# Patient Record
Sex: Female | Born: 1935 | Race: White | Hispanic: No | Marital: Married | Smoking: Never smoker
Health system: Southern US, Community
[De-identification: ages and names within clinical notes are randomized; demographics above are authoritative.]

## PROBLEM LIST (undated history)

## (undated) DIAGNOSIS — Z8719 Personal history of other diseases of the digestive system: Secondary | ICD-10-CM

## (undated) DIAGNOSIS — H9190 Unspecified hearing loss, unspecified ear: Secondary | ICD-10-CM

## (undated) DIAGNOSIS — G2581 Restless legs syndrome: Secondary | ICD-10-CM

## (undated) DIAGNOSIS — A0472 Enterocolitis due to Clostridium difficile, not specified as recurrent: Secondary | ICD-10-CM

## (undated) DIAGNOSIS — C801 Malignant (primary) neoplasm, unspecified: Secondary | ICD-10-CM

## (undated) DIAGNOSIS — I10 Essential (primary) hypertension: Secondary | ICD-10-CM

## (undated) DIAGNOSIS — R42 Dizziness and giddiness: Secondary | ICD-10-CM

## (undated) DIAGNOSIS — T8859XA Other complications of anesthesia, initial encounter: Secondary | ICD-10-CM

## (undated) DIAGNOSIS — M1990 Unspecified osteoarthritis, unspecified site: Secondary | ICD-10-CM

## (undated) DIAGNOSIS — E079 Disorder of thyroid, unspecified: Secondary | ICD-10-CM

## (undated) DIAGNOSIS — M722 Plantar fascial fibromatosis: Secondary | ICD-10-CM

## (undated) DIAGNOSIS — E039 Hypothyroidism, unspecified: Secondary | ICD-10-CM

## (undated) DIAGNOSIS — T4145XA Adverse effect of unspecified anesthetic, initial encounter: Secondary | ICD-10-CM

## (undated) HISTORY — PX: EYE SURGERY: SHX253

## (undated) HISTORY — PX: CHOLECYSTECTOMY: SHX55

## (undated) HISTORY — PX: TUBAL LIGATION: SHX77

## (undated) HISTORY — PX: ABDOMINAL HYSTERECTOMY: SHX81

## (undated) HISTORY — PX: TONSILLECTOMY: SUR1361

## (undated) HISTORY — PX: RETINAL DETACHMENT REPAIR W/ SCLERAL BUCKLE LE: SHX2338

---

## 1997-10-02 ENCOUNTER — Other Ambulatory Visit: Admission: RE | Admit: 1997-10-02 | Discharge: 1997-10-02 | Payer: Self-pay | Admitting: Obstetrics and Gynecology

## 1998-10-12 ENCOUNTER — Other Ambulatory Visit: Admission: RE | Admit: 1998-10-12 | Discharge: 1998-10-12 | Payer: Self-pay | Admitting: Obstetrics and Gynecology

## 1999-04-21 ENCOUNTER — Ambulatory Visit (HOSPITAL_COMMUNITY): Admission: RE | Admit: 1999-04-21 | Discharge: 1999-04-21 | Payer: Self-pay | Admitting: Gastroenterology

## 1999-08-02 ENCOUNTER — Encounter: Admission: RE | Admit: 1999-08-02 | Discharge: 1999-08-02 | Payer: Self-pay | Admitting: *Deleted

## 1999-10-27 ENCOUNTER — Other Ambulatory Visit: Admission: RE | Admit: 1999-10-27 | Discharge: 1999-10-27 | Payer: Self-pay | Admitting: Obstetrics and Gynecology

## 2000-03-30 ENCOUNTER — Ambulatory Visit (HOSPITAL_COMMUNITY): Admission: RE | Admit: 2000-03-30 | Discharge: 2000-03-30 | Payer: Self-pay | Admitting: Otolaryngology

## 2000-03-30 ENCOUNTER — Encounter: Payer: Self-pay | Admitting: Otolaryngology

## 2000-08-10 ENCOUNTER — Encounter: Admission: RE | Admit: 2000-08-10 | Discharge: 2000-08-10 | Payer: Self-pay | Admitting: Obstetrics and Gynecology

## 2000-08-10 ENCOUNTER — Encounter: Payer: Self-pay | Admitting: Obstetrics and Gynecology

## 2000-11-21 ENCOUNTER — Other Ambulatory Visit: Admission: RE | Admit: 2000-11-21 | Discharge: 2000-11-21 | Payer: Self-pay | Admitting: Obstetrics and Gynecology

## 2001-09-04 ENCOUNTER — Encounter: Payer: Self-pay | Admitting: Internal Medicine

## 2001-09-04 ENCOUNTER — Encounter: Admission: RE | Admit: 2001-09-04 | Discharge: 2001-09-04 | Payer: Self-pay | Admitting: Internal Medicine

## 2002-09-10 ENCOUNTER — Encounter: Admission: RE | Admit: 2002-09-10 | Discharge: 2002-09-10 | Payer: Self-pay | Admitting: Internal Medicine

## 2002-09-10 ENCOUNTER — Encounter: Payer: Self-pay | Admitting: Internal Medicine

## 2002-11-01 ENCOUNTER — Inpatient Hospital Stay (HOSPITAL_COMMUNITY): Admission: EM | Admit: 2002-11-01 | Discharge: 2002-11-04 | Payer: Self-pay | Admitting: Emergency Medicine

## 2002-11-01 ENCOUNTER — Encounter: Payer: Self-pay | Admitting: Internal Medicine

## 2002-11-02 ENCOUNTER — Encounter: Payer: Self-pay | Admitting: Internal Medicine

## 2003-10-07 ENCOUNTER — Encounter: Admission: RE | Admit: 2003-10-07 | Discharge: 2003-10-07 | Payer: Self-pay | Admitting: Obstetrics and Gynecology

## 2004-02-17 ENCOUNTER — Encounter: Admission: RE | Admit: 2004-02-17 | Discharge: 2004-02-17 | Payer: Self-pay | Admitting: Internal Medicine

## 2004-11-03 ENCOUNTER — Encounter: Admission: RE | Admit: 2004-11-03 | Discharge: 2004-11-03 | Payer: Self-pay | Admitting: Obstetrics and Gynecology

## 2005-05-16 ENCOUNTER — Inpatient Hospital Stay (HOSPITAL_COMMUNITY): Admission: EM | Admit: 2005-05-16 | Discharge: 2005-05-18 | Payer: Self-pay | Admitting: Internal Medicine

## 2005-11-01 ENCOUNTER — Encounter: Admission: RE | Admit: 2005-11-01 | Discharge: 2005-12-27 | Payer: Self-pay | Admitting: Otolaryngology

## 2005-11-22 ENCOUNTER — Encounter: Admission: RE | Admit: 2005-11-22 | Discharge: 2005-11-22 | Payer: Self-pay | Admitting: Obstetrics and Gynecology

## 2006-08-01 ENCOUNTER — Encounter: Admission: RE | Admit: 2006-08-01 | Discharge: 2006-08-01 | Payer: Self-pay | Admitting: Internal Medicine

## 2006-11-28 ENCOUNTER — Encounter: Admission: RE | Admit: 2006-11-28 | Discharge: 2006-11-28 | Payer: Self-pay | Admitting: Obstetrics and Gynecology

## 2007-11-29 ENCOUNTER — Encounter: Admission: RE | Admit: 2007-11-29 | Discharge: 2007-11-29 | Payer: Self-pay | Admitting: Obstetrics and Gynecology

## 2008-12-02 ENCOUNTER — Encounter: Admission: RE | Admit: 2008-12-02 | Discharge: 2008-12-02 | Payer: Self-pay | Admitting: Obstetrics and Gynecology

## 2009-12-16 ENCOUNTER — Encounter: Admission: RE | Admit: 2009-12-16 | Discharge: 2009-12-16 | Payer: Self-pay | Admitting: Obstetrics and Gynecology

## 2010-07-09 NOTE — Discharge Summary (Signed)
NAMEGRAELYN, BIHL                        ACCOUNT NO.:  [id number]   MEDICAL RECORD NO.:  [id number]                   PATIENT TYPE:  INP   LOCATION:  4735                                 FACILITY:  [facility]   PHYSICIAN:  Larina Earthly, M.D.                     DATE OF BIRTH:  30-Apr-1934   DATE OF ADMISSION:  11/01/2002  DATE OF DISCHARGE:  11/04/2002                                 DISCHARGE SUMMARY   ADDENDUM:   LABORATORY SECTION:  Portable chest x-ray revealed borderline cardiomegaly  on November 01, 2002.  Admission white blood cell count 10.2 with discharge  white blood cell count 9.0.  The hemoglobin ranged from 12.2-13.4.  The  hematocrit ranged from 36-39%.  The platelet count was in the 200s.  The PT  was 12.3 seconds and PTT 37 seconds.  AST 22, ALT 25, alkaline phosphatase  87, total bilirubin 0.5, albumin 3.7.  The CK was 214 with CK-MB 2.0 and  troponin I 0.1.  The second set was unremarkable.  The total cholesterol was  194, triglycerides 143, HDL cholesterol 54, and LDL cholesterol 111.  The  TSH was normal at 1.033.  The urinalysis was unremarkable.                                                Larina Earthly, M.D.    RA/MEDQ  D:  01/19/2003  T:  01/19/2003  Job:  [id number]

## 2010-07-09 NOTE — Discharge Summary (Signed)
NAMEOREATHA, FABRY                        ACCOUNT NO.:  [id number]   MEDICAL RECORD NO.:  [id number]                   PATIENT TYPE:  INP   LOCATION:  4735                                 FACILITY:  [facility]   PHYSICIAN:  Larina Earthly, M.D.                     DATE OF BIRTH:  15-Apr-1934   DATE OF ADMISSION:  11/01/2002  DATE OF DISCHARGE:  11/04/2002                                 DISCHARGE SUMMARY   DISCHARGE DIAGNOSES:  1. Atrial fibrillation with rapid ventricular response.  2. Chest pain, rule out myocardial infarction.  3. Mild asthmatic bronchitis with seasonal allergic rhinitis.  4. Hypertension.  5. Hyperlipidemia.  6. Hypothyroidism.  7. Anxiety disorder.   DISCHARGE MEDICATIONS:  1. Synthroid 50 mcg p.o. daily.  2. Zocor 40 mg p.o. daily.  3. Protonix 40 mg p.o. daily.  4. Flovent metered dose inhaler two puffs daily.  5. Toprol XL 100 mg daily.   DISCHARGE INSTRUCTIONS:  The patient was instructed to see Dr. Felipa Eth in one  to two weeks and to see Dr. Elease Hashimoto in one to two weeks for followup.   HISTORY OF PRESENT ILLNESS:  This is a 76 year old Caucasian female who  developed intermittent chest pain and shortness of breath that was  attributed to gastroesophageal reflux disease two to three days prior to  admission.  The patient took proton pump inhibitors and Tums with marginal  relief.  The patient exhibited little physical activity in the interim.  On  October 31, 2002, the patient developed intermittent palpitations with  chest pain, felt weak and presented to our office for further evaluation.  At that time she did not exhibit any nausea or vomiting, but did suggest  that she had some questionable sweats and it was unclear whether this was  different from her baseline.  In our office the patient's EKG revealed  atrial fibrillation with a rate up to 150 to 170 with a later heart rate  declining to 100.  The patient felt subjectively weak.  Blood pressure was  stable at 120/76.  The patient was afebrile.  Oxygen saturation was 96% on  room air.  Given the patient's apparent new onset atrial fibrillation and  chest discomfort, the patient was admitted for further evaluation and  management.   HOSPITAL COURSE:  Given the new onset atrial fibrillation, the patient was  started on a calcium channel blockade drip for rate control as well as  heparin intravenously.  Given her atrial fibrillation and chest pain and the  need to rule out for myocardial infarction, cardiology consultation was  obtained from Dr. Patty Sermons and Dr. Elease Hashimoto.  The patient was also continued  on thyroid replacement and was initiated on Zocor given her hyperlipidemic  status.  Previously the patient had been controlled on Benicar 40 mg p.o.  daily for hypertension.   Dr. Patty Sermons came and evaluated the patient and  agreed with ruling out the  patient for myocardial infarction given the patient's multiple risk factors  including family history, hypertension, hypercholesterolemia, and increased  stress.  Paroxysmal atrial fibrillation resolved.  He recommended continuing  IV heparin and beta blockade along with obtaining serial enzymes.   Beta blockade was increased over the next one to two days.  The patient was  up in the room and was chest pain-free.  Cardiac enzymes were unremarkable.  The patient was scheduled for cardiac catheterization given her multiple  risk factors and presenting symptomatology.  The patient was continued on  statins for her hyperlipidemia.  The patient underwent cardiac  catheterization on November 04, 2002, by Dr. Kristeen Miss.  This revealed  relatively smooth and normal coronary arteries, a few minor luminal  irregularities which are appropriate for somebody who is 76 years old.  There was normal left ventricular systolic function.  Given the benign  nature of the patient's cardiac catheterization and the short duration of  her atrial  fibrillation, the patient was discharged on November 04, 2002  with the above-mentioned medications with close followup scheduled with both  Dr. Elease Hashimoto and myself.                                                Larina Earthly, M.D.    RA/MEDQ  D:  01/19/2003  T:  01/19/2003  Job:  [id number]

## 2010-07-09 NOTE — H&P (Signed)
[name], BIDDLE                        ACCOUNT NO.:  [id number]   MEDICAL RECORD NO.:  [id number]                   PATIENT TYPE:  INP   LOCATION:  4735                                 FACILITY:  [facility]   PHYSICIAN:  Cassell Clement, M.D.              DATE OF BIRTH:  1934-08-11   DATE OF ADMISSION:  11/01/2002  DATE OF DISCHARGE:                                HISTORY & PHYSICAL   CHIEF COMPLAINT:  Chest pressure and rapid heart rate.   HISTORY OF PRESENT ILLNESS:  This is a 76 year old married Caucasian female  patient of Dr. Felipa Eth, who is admitted because of chest pressure and  paroxysmal atrial fibrillation. The patient has no known heart disease. She  did have a negative treadmill test Jun 23, 1999 by Dr. Elease Hashimoto. Recently, she  has been experiencing some exertional dyspnea associated with chest  pressure. She first noticed it when climbing up a steep grade in July while  [place]. The symptoms were initially treated with a trial  of proton pump inhibitor with equivocal response. However, the symptoms did  not seem to worsen until the beginning of this week when she noted chest  pressure with ordinary household activity. Last night she lay down to go to  sleep at about 10:00 p.m. and had the onset of chest pressure associated  with heart racing very fast. When she sat up and tried to walk, she felt  like she was about to pass out. The chest pressure and the rapid atrial  fibrillation persisted throughout the evening and then she went to see Dr.  Felipa Eth in his office today and while she was there, she spontaneously  converted from rapid atrial fibrillation back to sinus rhythm, when told  that she would need to go to the hospital.   FAMILY HISTORY:  Reveals that her father died at age 39 after having several  heart attacks. Mother died of Parkinson's. She had a brother who died of  cardiac arrest at age 22. She had a sister who died of cardiac arrest at age  74. She has 2 sisters who have had rapid heart action.   SOCIAL HISTORY:  She is married. Her husband has heart problems and recently  was found to have carotid artery blockage and will need carotid  endarterectomy soon and has seen Dr. Arbie Cookey. The patient has never smoked.  She does not use alcohol. As noted, she is married and they have 2 children,  age 87 and 27, in good health.   PAST MEDICAL HISTORY:  She has a history of borderline hypertension and  initially was on Diovan, more recently switched to Benicar. She has had  history of elevated lipids. She recalls that her LDL was in the range of  148. She had previously refused statin. She has a history of hypothyroidism  and is on Synthroid.   REVIEW OF SYSTEMS:  Reveals  that she has had a history of gastroesophageal  reflux disease. She has had a history of urge incontinence ever since her  last childbirth 27 years ago. Dr. Ambrose Mantle is her gynecologist. She has failed  a trial of Ditropan because of side effects. The patient has been having  complaints of aching in her legs and does have prominent varicose veins  bilaterally. Remainder of review of systems negative in details.   PHYSICAL EXAMINATION:  VITAL SIGNS: Blood pressure is 105/70, pulse 84 and  regular, respirations are normal.  GENERAL: An anxious woman in no acute distress.  SKIN: Clear.  HEENT: Pupils are equal, round, and reactive to light and accommodation.  Sclerae are clear. Mouth and pharynx normal. Carotids normal. Jugular venous  pressure normal. Thyroid not enlarged.  CHEST: Clear to auscultation and percussion. There is no chest wall palpable  tenderness.  HEART: No murmur, gallop or rub. There is no abnormal lift or heave.  ABDOMEN: Soft. Liver and spleen are not enlarged or tender.  EXTREMITIES: Show good peripheral pulses. No phlebitis and no edema. She  does have prominent varicose veins, which are tender posteriorly in both  calves.   LABORATORY DATA:   Electrocardiogram number 1 in Dr. Vicente Males office shows  rapid atrial fibrillation. Electrocardiogram  number 2 shows normal sinus  rhythm. No ischemic changes.   Chest x-ray was taken and is not presently available for review.   Negative cardiac enzymes so far with serial enzymes pending. Her point of  care cardiac markers in the emergency room did show an elevated myoglobin at  289 with CK MB of 3.1 and a troponin I of less than 0.05.   IMPRESSION:  1. Chest pressure, rule out myocardial infarction. Risk factors for coronary     artery disease include positive family history, hypertension,     hypercholesterolemia and recent increased family stress.  2. Paroxysmal atrial fibrillation, resolved.  3. Anxiety/stress.  4. Compensated hypothyroidism.  5. Hypercholesterolemia.   RECOMMENDATIONS:  Continue present medications as ordered by Dr. Felipa Eth.  Serial enzymes as ordered. Continue IV Heparin and beta blocker. We should  get an echocardiogram when available. I believe that she will need cardiac  catheterization this admission. Will set her up for Monday, November 04, 2002 with Dr. Elease Hashimoto. She should continue on IV heparin over the weekend  because of her recent atrial fibrillation and until we know the status of  her coronary arteries. Will follow this weekend in Dr. Harvie Bridge absence.                                                Cassell Clement, M.D.    TB/MEDQ  D:  11/01/2002  T:  11/01/2002  Job:  [id number]   cc:   Larina Earthly, M.D.  [address]  [place]  [place] [address]  Fax: [phone]   Vesta Mixer, M.D.  [address]  [place]  [place] [address]  Fax: [phone]

## 2010-07-09 NOTE — H&P (Signed)
NAMEHARGUN, [name]              ACCOUNT NO.:  [id number]   MEDICAL RECORD NO.:  [id number]          PATIENT TYPE:  INP   LOCATION:  1432                         FACILITY:  [facility]   PHYSICIAN:  Larina Earthly, M.D.        DATE OF BIRTH:  09/26/1934   DATE OF ADMISSION:  05/16/2005  DATE OF DISCHARGE:                                HISTORY & PHYSICAL   CHIEF COMPLAINT:  Nausea, vomiting and diarrhea.   HISTORY OF PRESENT ILLNESS:  This is a 76 year old Caucasian female known to  me with a history of hyperlipidemia, hypertension, multinodular goiter, mild  depression, and a history of coronary artery disease with a history of  paroxysmal atrial fibrillation followed by Dr. Elease Hashimoto, gastroesophageal  reflux disease, who sent to [facility] for a weekend trip by chartered  jet.  This trip started last Friday, May 13, 2005, and returned on Sunday,  May 15, 2005.  Early in the morning hours of May 15, 2005, the patient  developed significant diarrhea occurring 30 times over a 3-4 hour period of  time which awoke her from sleep, followed by nausea and vomiting of non-  bloody emesis.  She continued the same over the next several hours, and was  scheduled to return home on her chartered jet Sunday afternoon.  She,  indeed, did become a little presyncopal, requiring wheelchair assistance  prior to boarding the plane.  Upon arrival here in [place], she presented  to the ER via EMS secondary to continued nausea and vomiting with the  diarrhea abating.  She was found to be febrile with a temperature of 99.7  degrees Fahrenheit, and was thought to be dehydrated secondary to the  nausea, vomiting and diarrhea.  She was thought to possibly have a viral  gastroenteritis secondary to exposure during this recent trip.  She was  subsequently admitted, and now I was called to follow up on her admission  and continue her evaluation and management.  Discussion with the patient  reveals that her  caloric intake has improved significantly.  She has eaten  at least half of her lunch, which consisted of solid food.  She denies any  further nausea, vomiting or diarrhea.  She continues to have a fever, being  as high as 99.8 degrees this morning.  She is still feeling overall weak,  and attributes some of her recurrent nausea and vomiting last night in the  emergency room to increasing episodes of vertigo, for which she has seen Dr.  Hazeline Junker; this is per the patient's report.  She was given Zofran and  significant IV fluids in the emergency room, which continue at this current  time.   REVIEW OF SYSTEMS:  Negative for shaking rigors, chest pain, shortness of  breath, new musculoskeletal or neurological deficits, with the exception of  the vertigo, and one episode of shortness of breath during her emergency  room stay.   PROBLEM LIST:  1.  Hypertension.  2.  Obesity.  3.  History of Clostridium difficile colitis in 1995.  4.  Heel spurs.  5.  Irritable  bowel syndrome.  6.  Hiatal hernia with gastroesophageal reflux disease.  7.  History of diverticulosis.  8.  History of hysterectomy.  9.  History of cholecystectomy.  10. History of lichen sclerosus diagnosed by the dermatologist.  11. History of reactive airway disease.  12. History of hearing loss evaluated by Dr. Jearld Fenton.  13. Coronary artery disease and history of paroxysmal atrial fibrillation      followed by Dr. Elease Hashimoto.   SOCIAL HISTORY:  The patient is married for approximately 45 years.  She has  a high school education.  She is a housewife.  Denies any tobacco or alcohol  use.   FAMILY HISTORY:  1.  Significant for father who died at the age of 41 with coronary artery      disease.  2.  Her mother died at the age of 45 of Parkinson's disease.  3.  The patient does have 3 brothers and 4 sisters with a family history      significant for colon cancer, breast cancer and COPD.  4.  The patient has a daughter and  son who are alive and well.   ALLERGIES:  1.  SULFA.  2.  CODEINE.  3.  IODINE.   MEDICATION INTOLERANCES:  1.  HCTZ results in muscle cramps.  2.  WELLBUTRIN results in hot flashes.   CURRENT MEDICATIONS:  1.  Synthroid 50 mcg each day.  2.  Multivitamin.  3.  Benicar 20 mg each day.  4.  Toprol-XL 100 mg each day.  5.  Enteric-coated aspirin 81 mg recently discontinued secondary to the      thought that this may be causing the vertigo to get worse.  6.  Prilosec over-the-counter.  7.  Clobetasol topical ointment as needed.  8.  Xanax 0.5 mg p.r.n.   LABORATORY DATA:  Sodium 138, potassium 4.0, BUN 16, creatinine 0.9, glucose  122, hematocrit 39%, hemoglobin 13.3.  Additional labs unavailable.  CMET  and CBC are pending.   PHYSICAL EXAMINATION:  GENERAL:  We have an acutely ill-appearing Caucasian  female lying in bed flat, in no apparent distress, answering all of my  questions appropriately, clearly tired.  VITAL SIGNS:  Temperature 99.8 degrees Fahrenheit, blood pressure 129/52,  heart rate 109, respirations 22, oxygen saturation 92% on room air.  HEENT:  Sclerae anicteric.  Extraocular movements are intact.  There is no  nystagmus.  There are no oropharyngeal lesions.  NECK:  Supple.  There is no cervical lymphadenopathy.  LUNGS:  Clear to auscultation bilaterally.  CARDIOVASCULAR:  Tachycardic with a regular rhythm.  ABDOMEN:  Soft, nontender, nondistended.  Bowel sounds are present.  EXTREMITIES:  No edema.  NEUROLOGIC:  Grossly nonfocal.  There is no active synovitis.   ASSESSMENT AND PLAN:  1.  Nausea, vomiting and diarrhea, consistent with a questionable viral      gastroenteritis.  Will follow up on white blood cell count, continue IV      fluid.  Apparently, she may have contracted this during her weekend trip      to [facility].  Will also follow up on electrolytes and monitor     conservatively, given the fact that she appears to be clinically       resolving.  2.  Hypertension.  I will restart Toprol-XL, given her tachycardia, which is      sinus rhythm, and may add Benicar if indicated, or will defer to an      outpatient basis.  3.  Gastroesophageal reflux disease.  Will start Protonix in place of her      over-the-counter Prilosec.  4.  Hypothyroidism.  Will continue Synthroid 50 mcg p.o. daily.  5.  Coronary artery disease and atrial fibrillation, which is paroxysmal.      She did have one documented course of arrhythmias that lasted less than      3 seconds.  This has not been recurrent, and this was in the setting of      a temperature of 100.5 degrees Fahrenheit. Will continue to monitor on      telemetry.  6.  Vertigo.  May need additional follow up on an outpatient basis with Dr.      Jearld Fenton, but it appears to be increasing per the patient's report and      frequency.   DISPOSITION:  Will follow the patient conservatively and hopefully discharge  if this presumed viral gastroenteritis resolves within the next 24 hours.      Larina Earthly, M.D.  Electronically Signed     RA/MEDQ  D:  05/16/2005  T:  05/17/2005  Job:  [id number]

## 2010-07-09 NOTE — Cardiovascular Report (Signed)
[name], [name]                        ACCOUNT NO.:  [id number]   MEDICAL RECORD NO.:  [id number]                   PATIENT TYPE:  INP   LOCATION:  4735                                 FACILITY:  [facility]   PHYSICIAN:  Vesta Mixer, M.D.              DATE OF BIRTH:  08/02/1934   DATE OF PROCEDURE:  11/04/2002  DATE OF DISCHARGE:  11/04/2002                              CARDIAC CATHETERIZATION   Ms. Yerby is a middle age female with history of chest pain.  She had a  negative stress Cardiolite study many years ago.  She was admitted to the  hospital with rapid atrial fibrillation.  She was noted to have some chest  pain at that time.  She ruled out for myocardial infarction, but was  scheduled for heart catheterization for further evaluation of these chest  pains.   PROCEDURE:  Left heart catheterization with coronary angiography.   Initially, we attempted to cannulate the right femoral artery.  We were able  to get a good pulsatile flow, but the wire would not advance.  Small dye  injection was performed which revealed dye extravasation.  The patient  continued to have pulsatile flow.  Pressure was held on this site and we  moved to the left femoral artery.  Access was obtained without complications  using the modified Seldinger technique.   HEMODYNAMICS:  LV pressure was 136/16 with an aortic pressure of 135/68.   CORONARY ANGIOGRAPHY:  1. Left main coronary artery was smooth and normal.  2. The left anterior descending artery was smooth and normal.  3. The first diagonal artery is a moderate size vessel and is normal.  4. The left circumflex artery is a moderate size vessel.  5. The first and second obtuse marginal arteries are smooth and normal.  The     circumflex artery terminates as a small posterior lateral branch which is     normal.  6. The right coronary artery is moderate in size and is dominant. The     posterior descending artery has a few minor luminal  irregularities.  The     posterior lateral segment already has a few minor luminal irregularities.     There was also some catheter induced spasm in the proximal aspect of the     RCA, but this resolved when we pulled the catheter back.   LEFT VENTRICULOGRAM:  Left ventriculogram was performed was a 30 RAO  position.  There was a significant amount of catheter induced ectopy.  The  overall left ventricular systolic function was normal with an ejection  fraction of about 65%.  There was no mitral regurgitation.   A distal aortogram was performed due to the trouble in accessing the right  femoral artery.  It revealed a normal aortic bifurcation with normal femoral  arteries.  There had been significant improvement of the dye extravasation  during the case.  This  should not lead to any complications.   COMPLICATIONS:  None.    CONCLUSIONS:  1. Relatively smooth and normal coronary arteries.  She has a few minor     luminal irregularities which are appropriate for someone who is 76 years     old.  2. Normal left ventricular systolic function.                                                 Vesta Mixer, M.D.    PJN/MEDQ  D:  11/04/2002  T:  11/04/2002  Job:  [id number]

## 2010-07-09 NOTE — Discharge Summary (Signed)
[name], [name]              ACCOUNT NO.:  [id number]   MEDICAL RECORD NO.:  [id number]          PATIENT TYPE:  INP   LOCATION:  1432                         FACILITY:  [facility]   PHYSICIAN:  Larina Earthly, M.D.        DATE OF BIRTH:  Jul 10, 1934   DATE OF ADMISSION:  05/16/2005  DATE OF DISCHARGE:  05/18/2005                                 DISCHARGE SUMMARY   DISCHARGE DIAGNOSES:  1.  Presumed viral gastroenteritis, now resolved with IV fluids without      antibiotics.  Blood cultures negative.  Chest x-ray negative.      Urinalysis negative in the setting of recurrent nausea, vomiting, and      diarrhea.  2.  Vertigo with tinnitus.  Hearing loss and unremarkable brain MRI.      Possibility of Meniere's disease.  In need of followup with Dr. Suzanna Obey.  3.  Gastroesophageal reflux disease, controlled.  4.  Hypertension.  5.  Coronary artery disease with history of atrial fibrillation, paroxysmal.  6.  Multinodular goiter, on thyroid replacement.  7.  History of mild depression.   DISCHARGE MEDICATIONS:  1.  Synthroid 50 mcg each day.  2.  Multivitamins.  3.  Benicar 20 mg each day.  4.  Toprol XL 100 mg each day.  5.  Enteric-coated aspirin, discontinued.  6.  Over-the-counter Prilosec 20 mg each day.  7.  Clobetasol topical ointment as needed.  8.  Xanax 0.5 mg p.r.n.  9.  Meclizine 25 mg p.o. q.6h. p.r.n.  10. Phenergan 25 mg p.o. q.6h. p.r.n.   PERTINENT LABORATORY EVALUATION:  Brain MRI with contrast by verbal report  reveals severe atrophy and small vessel disease but no acute incident or  mass effect.   Chest x-ray reveals no active disease.   LABS ON DISCHARGE:  White blood cell count 5.8, hemoglobin 10.5, hematocrit  31.3%, platelet count 162 after IV hydration.  On admission, white blood  cell count 8.9, hemoglobin 12.3, hematocrit 35.9%, platelet count 210.  Discharge BMET, sodium 141, potassium 3.5, BUN 5, creatinine 0.7.  On  admission, BUN 12 and  creatinine 0.9.  Liver function tests normal.  Albumin  3, calcium 7.3.  Urinalysis unremarkable.  Blood cultures x2 negative, to  date.   HISTORY OF PRESENT ILLNESS:  This is a 76 year old Caucasian female with the  above-mentioned medical problem list who recently returned from a trip to  [facility] with nausea, nausea, and diarrhea.  Please see my history and  physical for extensive details.  Upon arrival back in [place], she  presented to the ER via EMS with continued nausea and vomiting with the  diarrhea abating.  She was febrile with a temperature of 99.7 degrees  Fahrenheit, thought to be dehydrated secondary to the same.  She was thought  to have a presumed viral gastroenteritis and was subsequently admitted with  IV fluids and antiemetics.   Review of systems at the time was remarkable for  increasing episodes of  vertigo along with nausea and vomiting, which was quite separate from  the  weekend's events.   HOSPITAL COURSE:  The patient continued to have recurrent fevers up to 103  degrees Fahrenheit.  Blood cultures were unremarkable.  Again, physical exam  was unremarkable for any clinical evidence of fever; however, she was  continued on IV fluids, antipyretics, and antiemetics.  For symptomatic  relief, antibiotics were not started, given the fact there was no clinical  source of bacterial infection at the time.  With these conservative  measures, her clinical picture quickly resolved by the morning of May 18, 2005, where she was up walking and ambulating the halls.  Her caloric intake  was good, and she remained afebrile.   The significant and concerning issue on the part of the patient was her  recurring and increasing episodes of vertigo, especially when she lies in a  supine fashion.  She did have a brain MRI on the night prior to her  discharge where she had significant small vessel disease and brain atrophy  but no mass effect or indication to give an  etiology for her vertigo by  verbal report.  A written report is still pending.  This was discussed at  length with the patient with respect to her increasing episodes of vertigo,  tinnitus, and the fact that she desires continued followup with Dr. Jearld Fenton  for the same.  We did discuss the possibility of Meniere's disease, and we  also talked about the fact that there may not be an easy remedy to this  issue.  Hence, she was discharged on the antiemetics, meclizine, and  benzodiazepines for symptomatic relief with planned followup with Dr. Jearld Fenton.      Larina Earthly, M.D.  Electronically Signed     RA/MEDQ  D:  05/18/2005  T:  05/19/2005  Job:  [id number]   cc:   Suzanna Obey, M.D.  Fax: [phone]

## 2010-07-09 NOTE — H&P (Signed)
[name], [name]                        ACCOUNT NO.:  [id number]   MEDICAL RECORD NO.:  [id number]                   PATIENT TYPE:  INP   LOCATION:  1844                                 FACILITY:  [facility]   PHYSICIAN:  Larina Earthly, M.D.                     DATE OF BIRTH:  01-19-1935   DATE OF ADMISSION:  11/01/2002  DATE OF DISCHARGE:                                HISTORY & PHYSICAL   CHIEF COMPLAINT:  Chest pain, palpitations, weakness.   HISTORY OF PRESENT ILLNESS:  This is a 76 year old Caucasian female who  developed intermittent chest pain and shortness of breath that the patient  attributed to gastroesophageal reflux disease on September 6 through 7,  earlier this week.  The patient took proton pump inhibitors and Tums with  marginal relief.  The patient exerted herself very little with respect to  physical activity.  On the evening of October 31, 2002, the patient  developed intermittent palpitations with chest pain.  She again thought this  was attributable to gastroesophageal reflux disease and/or upper respiratory  tract infection; however, she did call on the morning of 11/01/2002, to our  office for further evaluation.  She denied any nausea or vomiting and stated  that she did suffer from some sweats, but it was unclear if this was  different from her normal baseline.   In our office, EKG revealed rapids atrial fibrillation with a heart rate of  approximately 150 to 170 with mild orthostatic symptoms.  Blood pressure, as  documented below, was relatively normal with systolic blood pressure being  120.  Her heart rate later spontaneously reverted back to a normal sinus  rhythm without intervention with a heart rate of approximately 80 to 90.  The patient did feel subjectively weak.  Evaluation of the patient's  cardiovascular risk factors includes hyperlipidemia that is untreated,  hypertension treated, and a family history of early heart disease.  The  patient  denies any history of tobacco abuse or type 2 diabetes.  Of note,  the patient did have a negative stress test by Dr. Kristeen Miss in May  2001.   PAST MEDICAL HISTORY:  1. Exogenous obesity.  2. Hypertension.  3. C. difficile colitis.  4. Irritable bowel syndrome.  5. Depression and anxiety disorder.   REVIEW OF SYSTEMS:  As above but negative for fevers, chills, productive  cough, new musculoskeletal or neurological deficits.  Negative for  headaches, negative for blood per rectum or change in bowel habits.  Negative for abdominal pain.   PROBLEM LIST:  1. History of cholecystectomy.  2. History of hysterectomy in 1962, gynecologist, Dr. Ambrose Mantle.  3. Diverticulosis followed by Dr. Matthias Hughs.  4. History of hiatal hernia.  5. History of depression and anxiety disorder.  6. Irritable bowel syndrome.  7. Heel spurs.  8. C. difficile colitis in 1995.  9. Exogenous obesity.  10.  Hypertension diagnosed in 2001.   SOCIAL HISTORY:  The patient is married for approximately 43 years, has a  high school education, is currently a housewife.  Denies any tobacco or  alcohol abuse.   FAMILY HISTORY:  Significant for father having died of heart disease at the  age of 57 and mother having died at the age of 26 with Parkinson's disease.  Siblings family history is significant for colon cancer, breast cancer, and  emphysema in the setting of tobacco abuse.   CURRENT MEDICATIONS:  1. Synthroid 50 mcg p.o. daily.  2. Os-Cal 500 mg with vitamin D twice daily.  3. Multivitamins.  4. Xanax 0.25 mg p.r.n.  5. Prilosec over-the-counter.  6. Benicar 40 mg p.o. daily.   LABORATORY DATA:  During annual physical in June 2004 reveals a benign  urinalysis.  CBC reveals a white blood cell count of 7.3, hemoglobin 13.9,  hematocrit 40.4%, platelet count 273.  TSH 1.380, free T4 normal at 0.92.  Glucose fasting 105.  BUN 17, creatinine 0.6, sodium 142, potassium 4.4,  albumin 4.9, AST 29, ALT 27,  alkaline phosphatase 98, total albumin 0.4.  Total cholesterol 247, triglycerides 227, HDL cholesterol 53, LDL  cholesterol 149.  Stool cards x 3 were unremarkable.   EKG as above, currently now in normal sinus rhythm.   PHYSICAL EXAMINATION:  GENERAL:  Anxious Caucasian female who is conversant  and following commands and able to stand and ambulate slowly around the  facility.  VITAL SIGNS:  Heart rate ranges from 150 to 160 upon arrival in our  facility; however, currently is approximately 80 to 80 and in normal sinus  rhythm.  Blood pressure 120/76, 178 pounds, temperature 98.8 degrees F.  Oxygen saturation 96% on room air during atrial fibrillation.  HEENT:  Sclerae anicteric.  Extraocular movements are intact.  There are no  oropharyngeal lesions.  NECK:  Supple.  There is no cervical lymphadenopathy.  There is no  thyromegaly.  No carotid bruits are appreciated.  LUNGS:  Clear to auscultation bilaterally.  CARDIOVASCULAR:  Regular rate and rhythm without murmurs, rubs, or gallops  appreciated once the patient did revert back to a normal sinus rhythm.  ABDOMEN:  Soft, nontender, nondistended abdomen.  Bowel sounds are present  throughout.  EXTREMITIES:  No edema.  Pedal pulses are 2+ in bilateral feet.  The patient  has full range of motion of all four extremities.  NEUROLOGIC:  Exam is grossly nonfocal.   ASSESSMENT AND PLAN:  1. New onset atrial fibrillation, now back in normal sinus rhythm by exam     and electrocardiogram.  Will obtain rate control and possibly load with     digitoxin per cardiology consultation.  Will use calcium channel blockade     if indicated and start patient on low-dose beta blockade orally.  2. Chest pain, rule out myocardial infarction with serial enzymes.  Will     start heparin drip given recurrent chest pain during this past week.  We    will obtain EKG in the morning and obtain cardiology consult with Dr. Ronny Flurry given new onset  atrial fibrillation and chest pain that     occurred this past one week.  The patient may need cardiac     catheterization.  3. Of note, the patient's fasting CBGs were slightly elevated and will need     to be evaluated for glucose intolerance and metabolic syndrome.  4. Hypothyroidism.  Clinically euthyroid prior  to this past week.  Will     check TSH and free T4 given new onset atrial fibrillation.  TSH was     normal in June 2004 with dose of Synthroid at 50 mcg p.o. daily.  5. Hyperlipidemia.  Will start Zocor at 40 mg p.o. daily.  Defer diet and     exercise management as preferred by patient given the current issues.  6. Anxiety disorder.  Will continue Xanax as needed. Will continue Benicar     once blood pressure is stable in the setting of new onset atrial     fibrillation.                                                Larina Earthly, M.D.    RA/MEDQ  D:  11/01/2002  T:  11/02/2002  Job:  [id number]   cc:   Cassell Clement, M.D.  [address]  [place]  [place] [address]  Fax: [phone]   Vesta Mixer, M.D.  [address], [address]  [place]  [place] [address]  Fax: [phone]

## 2010-12-21 ENCOUNTER — Other Ambulatory Visit: Payer: Self-pay | Admitting: Internal Medicine

## 2010-12-21 DIAGNOSIS — Z1231 Encounter for screening mammogram for malignant neoplasm of breast: Secondary | ICD-10-CM

## 2011-01-26 ENCOUNTER — Ambulatory Visit
Admission: RE | Admit: 2011-01-26 | Discharge: 2011-01-26 | Disposition: A | Discharge status: Home / Self Care | Payer: No Typology Code available for payment source | Source: Ambulatory Visit | Attending: Internal Medicine | Admitting: Internal Medicine

## 2011-01-26 DIAGNOSIS — Z1231 Encounter for screening mammogram for malignant neoplasm of breast: Secondary | ICD-10-CM

## 2011-12-27 ENCOUNTER — Other Ambulatory Visit: Payer: Self-pay | Admitting: Internal Medicine

## 2011-12-27 DIAGNOSIS — Z1231 Encounter for screening mammogram for malignant neoplasm of breast: Secondary | ICD-10-CM

## 2012-02-03 ENCOUNTER — Ambulatory Visit
Admission: RE | Admit: 2012-02-03 | Discharge: 2012-02-03 | Disposition: A | Discharge status: Home / Self Care | Payer: Medicare Other | Source: Ambulatory Visit | Attending: Internal Medicine | Admitting: Internal Medicine

## 2012-02-03 DIAGNOSIS — Z1231 Encounter for screening mammogram for malignant neoplasm of breast: Secondary | ICD-10-CM

## 2012-03-31 ENCOUNTER — Encounter (HOSPITAL_COMMUNITY): Payer: Self-pay | Admitting: Emergency Medicine

## 2012-03-31 ENCOUNTER — Inpatient Hospital Stay (HOSPITAL_COMMUNITY)
Admission: EM | Admit: 2012-03-31 | Discharge: 2012-04-03 | DRG: 641 | Disposition: A | Discharge status: Home / Self Care | Payer: Medicare Other | Attending: Internal Medicine | Admitting: Internal Medicine

## 2012-03-31 DIAGNOSIS — Z66 Do not resuscitate: Secondary | ICD-10-CM | POA: Diagnosis present

## 2012-03-31 DIAGNOSIS — Z79899 Other long term (current) drug therapy: Secondary | ICD-10-CM

## 2012-03-31 DIAGNOSIS — F419 Anxiety disorder, unspecified: Secondary | ICD-10-CM | POA: Diagnosis present

## 2012-03-31 DIAGNOSIS — E039 Hypothyroidism, unspecified: Secondary | ICD-10-CM | POA: Diagnosis present

## 2012-03-31 DIAGNOSIS — R112 Nausea with vomiting, unspecified: Secondary | ICD-10-CM | POA: Diagnosis present

## 2012-03-31 DIAGNOSIS — I498 Other specified cardiac arrhythmias: Secondary | ICD-10-CM | POA: Diagnosis present

## 2012-03-31 DIAGNOSIS — R000 Tachycardia, unspecified: Secondary | ICD-10-CM | POA: Diagnosis present

## 2012-03-31 DIAGNOSIS — R82998 Other abnormal findings in urine: Secondary | ICD-10-CM | POA: Diagnosis present

## 2012-03-31 DIAGNOSIS — E860 Dehydration: Principal | ICD-10-CM | POA: Diagnosis present

## 2012-03-31 DIAGNOSIS — Z7982 Long term (current) use of aspirin: Secondary | ICD-10-CM

## 2012-03-31 DIAGNOSIS — R197 Diarrhea, unspecified: Secondary | ICD-10-CM | POA: Diagnosis present

## 2012-03-31 DIAGNOSIS — R509 Fever, unspecified: Secondary | ICD-10-CM | POA: Diagnosis present

## 2012-03-31 DIAGNOSIS — I10 Essential (primary) hypertension: Secondary | ICD-10-CM | POA: Diagnosis present

## 2012-03-31 DIAGNOSIS — R1115 Cyclical vomiting syndrome unrelated to migraine: Secondary | ICD-10-CM | POA: Diagnosis present

## 2012-03-31 DIAGNOSIS — F411 Generalized anxiety disorder: Secondary | ICD-10-CM

## 2012-03-31 HISTORY — DX: Essential (primary) hypertension: I10

## 2012-03-31 HISTORY — DX: Enterocolitis due to Clostridium difficile, not specified as recurrent: A04.72

## 2012-03-31 HISTORY — DX: Disorder of thyroid, unspecified: E07.9

## 2012-03-31 LAB — CBC WITH DIFFERENTIAL/PLATELET
Basophils Absolute: 0 10*3/uL (ref 0.0–0.1)
Basophils Relative: 0 % (ref 0–1)
Eosinophils Absolute: 0 10*3/uL (ref 0.0–0.7)
HCT: 41.6 % (ref 36.0–46.0)
Lymphs Abs: 0.4 10*3/uL — ABNORMAL LOW (ref 0.7–4.0)
Monocytes Absolute: 0.4 10*3/uL (ref 0.1–1.0)
Neutrophils Relative %: 92 % — ABNORMAL HIGH (ref 43–77)
Platelets: 224 10*3/uL (ref 150–400)
RDW: 12.7 % (ref 11.5–15.5)
WBC: 11.3 10*3/uL — ABNORMAL HIGH (ref 4.0–10.5)

## 2012-03-31 LAB — URINALYSIS, MICROSCOPIC ONLY
Hgb urine dipstick: NEGATIVE
Ketones, ur: NEGATIVE mg/dL
Nitrite: NEGATIVE
pH: 5.5 (ref 5.0–8.0)

## 2012-03-31 LAB — BASIC METABOLIC PANEL
BUN: 24 mg/dL — ABNORMAL HIGH (ref 6–23)
Potassium: 3.9 mEq/L (ref 3.5–5.1)
Sodium: 140 mEq/L (ref 135–145)

## 2012-03-31 LAB — MAGNESIUM: Magnesium: 1.5 mg/dL (ref 1.5–2.5)

## 2012-03-31 MED ORDER — ACETAMINOPHEN 650 MG RE SUPP: 650.0000 mg | Freq: Four times a day (QID) | RECTAL | Status: DC | PRN

## 2012-03-31 MED ORDER — KCL IN DEXTROSE-NACL 20-5-0.45 MEQ/L-%-% IV SOLN
INTRAVENOUS | Status: DC
  Administered 2012-03-31 – 2012-04-03 (×8): via INTRAVENOUS
  Filled 2012-03-31 (×10): qty 1000

## 2012-03-31 MED ORDER — ACETAMINOPHEN 325 MG PO TABS
650.0000 mg | ORAL_TABLET | Freq: Once | ORAL | Status: AC
  Administered 2012-03-31: 650 mg via ORAL
  Filled 2012-03-31: qty 2

## 2012-03-31 MED ORDER — ASPIRIN 81 MG PO CHEW
81.0000 mg | CHEWABLE_TABLET | Freq: Every day | ORAL | Status: DC
  Administered 2012-03-31 – 2012-04-03 (×4): 81 mg via ORAL
  Filled 2012-03-31 (×4): qty 1

## 2012-03-31 MED ORDER — INSULIN REGULAR HUMAN 100 UNIT/ML IJ SOLN: 10.0000 [IU] | Freq: Once | INTRAMUSCULAR | Status: DC

## 2012-03-31 MED ORDER — SODIUM CHLORIDE 0.9 % IV BOLUS (SEPSIS)
1000.0000 mL | INTRAVENOUS | Status: DC
  Administered 2012-03-31: 1000 mL via INTRAVENOUS

## 2012-03-31 MED ORDER — GI COCKTAIL ~~LOC~~
30.0000 mL | Freq: Once | ORAL | Status: AC
  Administered 2012-03-31: 30 mL via ORAL
  Filled 2012-03-31: qty 30

## 2012-03-31 MED ORDER — ONDANSETRON HCL 4 MG/2ML IJ SOLN
4.0000 mg | Freq: Once | INTRAMUSCULAR | Status: AC
  Administered 2012-03-31: 4 mg via INTRAVENOUS
  Filled 2012-03-31: qty 2

## 2012-03-31 MED ORDER — HEPARIN SODIUM (PORCINE) 5000 UNIT/ML IJ SOLN
5000.0000 [IU] | Freq: Three times a day (TID) | INTRAMUSCULAR | Status: DC
  Administered 2012-03-31 – 2012-04-03 (×8): 5000 [IU] via SUBCUTANEOUS
  Filled 2012-03-31 (×11): qty 1

## 2012-03-31 MED ORDER — ONDANSETRON HCL 4 MG PO TABS: 4.0000 mg | ORAL_TABLET | Freq: Four times a day (QID) | ORAL | Status: DC | PRN

## 2012-03-31 MED ORDER — INSULIN ASPART 100 UNIT/ML ~~LOC~~ SOLN: 10.0000 [IU] | Freq: Once | SUBCUTANEOUS | Status: DC

## 2012-03-31 MED ORDER — PANTOPRAZOLE SODIUM 40 MG PO TBEC
40.0000 mg | DELAYED_RELEASE_TABLET | Freq: Every day | ORAL | Status: DC
  Administered 2012-03-31 – 2012-04-03 (×4): 40 mg via ORAL
  Filled 2012-03-31 (×4): qty 1

## 2012-03-31 MED ORDER — SODIUM CHLORIDE 0.9 % IV BOLUS (SEPSIS)
1000.0000 mL | INTRAVENOUS | Status: AC
  Administered 2012-03-31: 1000 mL via INTRAVENOUS

## 2012-03-31 MED ORDER — ALPRAZOLAM 0.5 MG PO TABS
0.5000 mg | ORAL_TABLET | Freq: Once | ORAL | Status: AC
  Administered 2012-03-31: 0.5 mg via ORAL
  Filled 2012-03-31: qty 1

## 2012-03-31 MED ORDER — LEVOTHYROXINE SODIUM 50 MCG PO TABS
50.0000 ug | ORAL_TABLET | Freq: Every day | ORAL | Status: DC
  Administered 2012-04-01 – 2012-04-03 (×3): 50 ug via ORAL
  Filled 2012-03-31 (×5): qty 1

## 2012-03-31 MED ORDER — FAMOTIDINE 20 MG PO TABS
20.0000 mg | ORAL_TABLET | Freq: Once | ORAL | Status: AC
  Administered 2012-03-31: 20 mg via ORAL
  Filled 2012-03-31: qty 1

## 2012-03-31 MED ORDER — SODIUM CHLORIDE 0.9 % IJ SOLN
3.0000 mL | Freq: Two times a day (BID) | INTRAMUSCULAR | Status: DC
  Administered 2012-04-03: 3 mL via INTRAVENOUS

## 2012-03-31 MED ORDER — ONDANSETRON HCL 4 MG/2ML IJ SOLN: 4.0000 mg | Freq: Four times a day (QID) | INTRAMUSCULAR | Status: DC | PRN

## 2012-03-31 MED ORDER — ACETAMINOPHEN 325 MG PO TABS
650.0000 mg | ORAL_TABLET | Freq: Four times a day (QID) | ORAL | Status: DC | PRN
  Administered 2012-04-02 – 2012-04-03 (×2): 650 mg via ORAL
  Filled 2012-03-31 (×2): qty 2

## 2012-03-31 NOTE — H&P (Signed)
[facility] History and Physical  [name] QMV:[id number] DOB: Mar 14, 1934 DOA: 03/31/2012  Referring physician: Dr. Romeo Apple PCP: No primary provider on file.  Specialists: none  Chief Complaint: nausea/vomitting/diarrhea  HPI: [name] is a 78 y.o. female  With history of htn, hypothyroidism, h/o c diff colitis presenting to the ED after developing the above complaints.  Reportedly this occurred after patient ate some sea food last night at a restaurant.  She states that she ate the flounder and salad. This morning around 2 AM she mentions that she developed nausea and subsequently had bout of emesis and diarrhea.  Reportedly 3 bout of emesis/diarrhea an hour since onset.  As listed above since the problem was persistent and did not improve she decided to come to the ED for further evaluation.  She also endorses weakness and dry mouth.  In the ED patient received 2 L normal saline, gi cocktail, pepcid, zofran.  She had some relief with this medication.  Given her persistent weakness, sinus tachycardia, and dehydration we were consulted for further evaluation and recommendations.  Review of Systems: 10 point review of systems reviewed and negative unless otherwise mentioned above.  Past Medical History  Diagnosis Date  . Hypertension   . Thyroid disease   . C. difficile colitis    Past Surgical History  Procedure Laterality Date  . Abdominal hysterectomy    . Tubal ligation    . Cholecystectomy    . Tonsillectomy    . Eye surgery     Social History:  reports that she has never smoked. She does not have any smokeless tobacco history on file. She reports that she does not drink alcohol or use illicit drugs. Lives at home with husband  Can patient participate in ADLs? Yes but not currently due to acute illness  Allergies  Allergen Reactions  . Iodine Rash  . Sulfa Antibiotics Rash    No family history on file. Family history of colon cancer reportedly  Prior  to Admission medications   Medication Sig Start Date End Date Taking? Authorizing Provider  ALPRAZolam Prudy Feeler) 0.5 MG tablet Take 0.5 mg by mouth 3 (three) times daily as needed for sleep or anxiety.   Yes Historical Provider, MD  aspirin 81 MG chewable tablet Chew 81 mg by mouth daily.   Yes Historical Provider, MD  levothyroxine (SYNTHROID, LEVOTHROID) 50 MCG tablet Take 50 mcg by mouth every morning.   Yes Historical Provider, MD  metoprolol succinate (TOPROL-XL) 100 MG 24 hr tablet Take 100 mg by mouth every morning. Take with or immediately following a meal.   Yes Historical Provider, MD  Multiple Vitamin (MULTIVITAMIN WITH MINERALS) TABS Take 1 tablet by mouth daily.   Yes Historical Provider, MD  olmesartan (BENICAR) 20 MG tablet Take 20 mg by mouth every morning.   Yes Historical Provider, MD  omeprazole (PRILOSEC) 20 MG capsule Take 20 mg by mouth daily.   Yes Historical Provider, MD   Physical Exam: Filed Vitals:   03/31/12 1430 03/31/12 1502 03/31/12 1545 03/31/12 1600  BP: 116/53   109/33  Pulse: 113 115 113 112  Temp:  98.5 F (36.9 C)    TempSrc:  Oral    Resp: 19  20 21   SpO2: 95% 100% 93% 92%     General:  Pt in NAD, alert and awake  Eyes: EOMI, non icteric  ENT: normal exterior appearance, dry mucous membranes  Neck: supple, no goiter  Cardiovascular: S1 and s2 wnl, no mrg  Respiratory: CTA BL, no wheezes  Abdomen: soft, NT, ND  Skin: warm and dry  Musculoskeletal: no cyanosis or clubbing  Psychiatric: mood and affect appropriate  Neurologic: moves all extremities equally, no facial asymmetry  Labs on Admission:  Basic Metabolic Panel:  Recent Labs Lab 03/31/12 1126  NA 140  K 3.9  CL 101  CO2 23  GLUCOSE 160*  BUN 24*  CREATININE 1.02  CALCIUM 8.5   Liver Function Tests: No results found for this basename: AST, ALT, ALKPHOS, BILITOT, PROT, ALBUMIN,  in the last 168 hours No results found for this basename: LIPASE, AMYLASE,  in the last  168 hours No results found for this basename: AMMONIA,  in the last 168 hours CBC:  Recent Labs Lab 03/31/12 1126  WBC 11.3*  NEUTROABS 10.4*  HGB 13.5  HCT 41.6  MCV 91.8  PLT 224   Cardiac Enzymes: No results found for this basename: CKTOTAL, CKMB, CKMBINDEX, TROPONINI,  in the last 168 hours  BNP (last 3 results) No results found for this basename: PROBNP,  in the last 8760 hours CBG: No results found for this basename: GLUCAP,  in the last 168 hours  Radiological Exams on Admission: No results found.  EKG: Independently reviewed. Sinus tachycardia, no st elevations or depressions  Assessment/Plan Active Problems:    1. Dehydration: Patient with elevated BUN/Creatinine ratio and 2ary to nausea, vomiting, and diarrhea.  At this point will start patient on MIVF's.  Has already received 2 Liters NS in ED.  Will monitor in telemetry. 2. Nausea/vomiting/diarrhea: Given history could be viral gastroenteritis or more likely food poisoning from recent seafood ingestion (suspecting vibrio cholerae although other organisms possible).  Will treat supportively.  Obtain stool culture and c difficil although there is no reports of recent antibiotic ingestion and as such less likely.  Most likely caused number one.  Maintain on IVF's and monitor.  Will avoid immodium as this could be likely due to infectious etiology. Check magnesium, phosphorus levels. 3. Hypothyroidism: Continue home regimen synthroid.  Check TSH levels 4. Hypertension: given # 1 patient has had soft blood pressures and as such will hold antihypertensive medication 5. Anxiety: continue home xanax regimen.  Code Status: DNR Family Communication: Spoke with patient and sister in law at bedside. Disposition Plan: Discharge likely in 1-2 days with rehydration and improvement in N/V/D  Time spent: > 60 minutes  Penny Pia [facility] Pager [phone]  If 7PM-7AM, please contact  night-coverage [email or web address] Password [facility] 03/31/2012, 4:14 PM

## 2012-03-31 NOTE — ED Notes (Signed)
Per EMS pt came from home where she started having n/v/d around 2am this morning. Pt pt had 4mg  zofran and in route. Pt has vertigo and cant lay flat on back or will get sick on her stomach.

## 2012-03-31 NOTE — ED Notes (Signed)
Patient informed we need a urine specimen, states she is unable to provide one at this time. RN Darl Pikes notified.

## 2012-03-31 NOTE — ED Provider Notes (Signed)
History     CSN: [id number]  Arrival date & time 03/31/12  1015   First MD Initiated Contact with Patient 03/31/12 1029      Chief Complaint  Patient presents with  . Nausea  . Emesis    (Consider location/radiation/quality/duration/timing/severity/associated sxs/prior treatment) Patient is a 78 y.o. female presenting with vomiting. The history is provided by the patient.  Emesis Severity:  Moderate Timing:  Constant Number of daily episodes:  Too many to count Quality:  Stomach contents and bilious material Able to tolerate:  Liquids Chronicity:  New Recent urination:  Normal Relieved by:  Antiemetics Worsened by:  Nothing tried Ineffective treatments:  None tried Associated symptoms: diarrhea   Associated symptoms: no abdominal pain, no cough, no fever and no headaches     Past Medical History  Diagnosis Date  . Hypertension   . Thyroid disease   . C. difficile colitis     Past Surgical History  Procedure Laterality Date  . Abdominal hysterectomy    . Tubal ligation    . Cholecystectomy    . Tonsillectomy    . Eye surgery      No family history on file.  History  Substance Use Topics  . Smoking status: Never Smoker   . Smokeless tobacco: Not on file  . Alcohol Use: No    OB History   Grav Para Term Preterm Abortions TAB SAB Ect Mult Living                  Review of Systems  Constitutional: Negative for fever and fatigue.  HENT: Negative for congestion, drooling and neck pain.   Eyes: Negative for pain.  Respiratory: Negative for cough and shortness of breath.   Cardiovascular: Negative for chest pain.  Gastrointestinal: Positive for nausea, vomiting and diarrhea. Negative for abdominal pain.  Genitourinary: Negative for dysuria and hematuria.  Musculoskeletal: Negative for back pain and gait problem.  Skin: Negative for color change.  Neurological: Negative for dizziness and headaches.  Hematological: Negative for adenopathy.   Psychiatric/Behavioral: Negative for behavioral problems.  All other systems reviewed and are negative.    Allergies  Iodine and Sulfa antibiotics  Home Medications  No current outpatient prescriptions on file.  BP 99/50  Pulse 106  Temp(Src) 98.2 F (36.8 C) (Oral)  Resp 16  Ht 5' 0.5" (1.537 m)  Wt 187 lb (84.823 kg)  BMI 35.91 kg/m2  SpO2 92%  Physical Exam  Nursing note and vitals reviewed. Constitutional: She is oriented to person, place, and time. She appears well-developed and well-nourished.  HENT:  Head: Normocephalic.  Mouth/Throat: No oropharyngeal exudate.  Mild tachy oral mucosa.  Eyes: Conjunctivae and EOM are normal. Pupils are equal, round, and reactive to light.  Neck: Normal range of motion. Neck supple.  Cardiovascular: Regular rhythm, normal heart sounds and intact distal pulses.  Exam reveals no gallop and no friction rub.   No murmur heard. Mild tachcyardia, 109  Pulmonary/Chest: Effort normal and breath sounds normal. No respiratory distress. She has no wheezes.  Abdominal: Soft. Bowel sounds are normal. There is no tenderness. There is no rebound and no guarding.  Musculoskeletal: Normal range of motion. She exhibits no edema and no tenderness.  Neurological: She is alert and oriented to person, place, and time.  Skin: Skin is warm and dry.  Psychiatric: She has a normal mood and affect. Her behavior is normal.    ED Course  Procedures (including critical care time)  Labs Reviewed  URINALYSIS, MICROSCOPIC ONLY - Abnormal; Notable for the following:    APPearance CLOUDY (*)    Leukocytes, UA MODERATE (*)    Squamous Epithelial / LPF FEW (*)    All other components within normal limits  CBC WITH DIFFERENTIAL - Abnormal; Notable for the following:    WBC 11.3 (*)    Neutrophils Relative 92 (*)    Neutro Abs 10.4 (*)    Lymphocytes Relative 4 (*)    Lymphs Abs 0.4 (*)    All other components within normal limits  BASIC METABOLIC PANEL -  Abnormal; Notable for the following:    Glucose, Bld 160 (*)    BUN 24 (*)    GFR calc non Af Amer 52 (*)    GFR calc Af Amer 61 (*)    All other components within normal limits  URINE CULTURE  CLOSTRIDIUM DIFFICILE BY PCR  STOOL CULTURE  MAGNESIUM  PHOSPHORUS  CBC  BASIC METABOLIC PANEL  TSH   No results found.   1. Dehydration   2. Anxiety   3. Nausea vomiting and diarrhea   4. Sinus tachycardia       Date: 03/31/2012  Rate: 109  Rhythm: sinus tachycardia  QRS Axis: normal  Intervals: normal  ST/T Wave abnormalities: normal  Conduction Disutrbances:none  Narrative Interpretation: No new ST or T wave changes cw ischemia  Old EKG Reviewed: changes noted  MDM  10:22 PM 78 y.o. female w hx of HTN, thyroid dis, c. Dif after taking augmentin in 1995 pw sudden onset of N/V/D at 2am this morning. Pt notes sx worsened, presents via EMS. Pt is afebrile, mild tachycardia, denies any cp/abd pain on exam. Suspect viral gastro vs food poisoning as pt had seafood last night. Pt notes she had vertigo in 1995 w/ c. Dif, she does not have vertigo today, but feels like she may develop it due to dehydration. Will hydrate w/ IVF, zofran, screening ecg/urine/labs.   Will admit to hospitalist.   Clinical Impression 1. Dehydration   2. Anxiety   3. Nausea vomiting and diarrhea   4. Sinus tachycardia          Purvis Sheffield, MD 03/31/12 2222

## 2012-03-31 NOTE — ED Notes (Signed)
Attempted to call report. RN unable to take report at this time. Will call back when available

## 2012-03-31 NOTE — ED Notes (Signed)
Attempted to call report again, Rn will call back in 5 minutes

## 2012-03-31 NOTE — ED Notes (Signed)
Pt given water per EDP. 

## 2012-04-01 DIAGNOSIS — R1115 Cyclical vomiting syndrome unrelated to migraine: Secondary | ICD-10-CM

## 2012-04-01 DIAGNOSIS — I10 Essential (primary) hypertension: Secondary | ICD-10-CM

## 2012-04-01 LAB — HEPATIC FUNCTION PANEL
AST: 22 U/L (ref 0–37)
Albumin: 2.8 g/dL — ABNORMAL LOW (ref 3.5–5.2)
Alkaline Phosphatase: 47 U/L (ref 39–117)
Total Bilirubin: 0.3 mg/dL (ref 0.3–1.2)
Total Protein: 5.5 g/dL — ABNORMAL LOW (ref 6.0–8.3)

## 2012-04-01 LAB — BASIC METABOLIC PANEL
Calcium: 7 mg/dL — ABNORMAL LOW (ref 8.4–10.5)
Creatinine, Ser: 0.84 mg/dL (ref 0.50–1.10)
GFR calc Af Amer: 77 mL/min — ABNORMAL LOW (ref >90)

## 2012-04-01 LAB — CBC
MCH: 30.6 pg (ref 26.0–34.0)
MCHC: 32.9 g/dL (ref 30.0–36.0)
MCV: 92.9 fL (ref 78.0–100.0)
Platelets: 160 10*3/uL (ref 150–400)
RDW: 13.5 % (ref 11.5–15.5)

## 2012-04-01 MED ORDER — ALPRAZOLAM 0.25 MG PO TABS
0.2500 mg | ORAL_TABLET | Freq: Every evening | ORAL | Status: DC | PRN
  Administered 2012-04-01 – 2012-04-02 (×2): 0.25 mg via ORAL
  Filled 2012-04-01 (×2): qty 1

## 2012-04-01 NOTE — Progress Notes (Signed)
Utilization review completed.  

## 2012-04-01 NOTE — Progress Notes (Signed)
Pt tolerated clear liquids.

## 2012-04-01 NOTE — Progress Notes (Signed)
[facility] PROGRESS NOTE  VALENA IVANOV NFA:[id number] DOB: 1934-04-08 DOA: 03/31/2012 PCP: No primary provider on file.  Assessment/Plan: Dehydration -Secondary to intractable vomiting/diarrhea -Continue intravenous fluids -Advance diet as tolerated Intractable vomiting/diarrhea -Influenza PCR -Patient had low-grade fever, 100.59F -Vomiting and diarrhea have remitted -C. difficile PCR -Patient takes cephalexin 500 mg once a week -Advance diet as tolerated -Avoid antimotility agents; patient states she took Imodium prior to calling the ambulance Sinus tachycardia -Secondary to dehydration Hypothyroidism -Continue Synthroid -TSH 0.431 Hypertension -Blood pressures remained soft -Continue to hold metoprolol succinate, Benicar    Procedures/Studies:  No results found.      Subjective: Patient denies fevers, chills, chest pain, shortness breath, abdominal pain, dysuria, hematuria, rashes. Nausea, vomiting, diarrhea have remitted. Denies any dizziness or syncope.  Objective: Filed Vitals:   03/31/12 1805 03/31/12 1845 03/31/12 2013 04/01/12 0518  BP:  102/49 99/50 96/41   Pulse:  105 106 93  Temp:  99.9 F (37.7 C) 98.2 F (36.8 C) 98.9 F (37.2 C)  TempSrc:  Oral Oral Oral  Resp:  17 16 16   Height: 5' 0.5" (1.537 m)     Weight: 84.823 kg (187 lb)     SpO2:  98% 92% 94%   No intake or output data in the 24 hours ending 04/01/12 0931 Weight change:  Exam:   General:  Pt is alert, follows commands appropriately, not in acute distress  HEENT: No icterus, No thrush,  [place]/AT  Cardiovascular: RRR, S1/S2, no rubs, no gallops  Respiratory: CTA bilaterally, no wheezing, no crackles, no rhonchi  Abdomen: Soft/+BS, non tender, non distended, no guarding  Extremities: trace edema, No lymphangitis, No petechiae, No rashes, no synovitis  Data Reviewed: Basic Metabolic Panel:  Recent Labs Lab 03/31/12 1126 03/31/12 1945 04/01/12 0425  NA 140  --  136   K 3.9  --  3.7  CL 101  --  106  CO2 23  --  23  GLUCOSE 160*  --  127*  BUN 24*  --  16  CREATININE 1.02  --  0.84  CALCIUM 8.5  --  7.0*  MG  --  1.5  --   PHOS  --  2.6  --    Liver Function Tests: No results found for this basename: AST, ALT, ALKPHOS, BILITOT, PROT, ALBUMIN,  in the last 168 hours No results found for this basename: LIPASE, AMYLASE,  in the last 168 hours No results found for this basename: AMMONIA,  in the last 168 hours CBC:  Recent Labs Lab 03/31/12 1126 04/01/12 0425  WBC 11.3* 7.9  NEUTROABS 10.4*  --   HGB 13.5 11.2*  HCT 41.6 34.0*  MCV 91.8 92.9  PLT 224 160   Cardiac Enzymes: No results found for this basename: CKTOTAL, CKMB, CKMBINDEX, TROPONINI,  in the last 168 hours BNP: No components found with this basename: POCBNP,  CBG: No results found for this basename: GLUCAP,  in the last 168 hours  No results found for this or any previous visit (from the past 240 hour(s)).   Scheduled Meds: . aspirin  81 mg Oral Daily  . heparin  5,000 Units Subcutaneous Q8H  . levothyroxine  50 mcg Oral QAC breakfast  . pantoprazole  40 mg Oral Daily  . sodium chloride  3 mL Intravenous Q12H   Continuous Infusions: . dextrose 5 % and 0.45 % NaCl with KCl 20 mEq/L 125 mL/hr at 04/01/12 0320     Charmika Macdonnell, DO  [facility] Pager  [phone]  If 7PM-7AM, please contact night-coverage [email or web address] Password [email or web address] 04/01/2012, 9:31 AM   LOS: 1 day

## 2012-04-01 NOTE — Progress Notes (Signed)
Pt had no N/V and no BM's during my 12 hour shift. Pt's vitals remained stable.

## 2012-04-01 NOTE — Progress Notes (Deleted)
Pt tolerated clear liquids fine. Will advance diet to full liquids.

## 2012-04-02 DIAGNOSIS — R509 Fever, unspecified: Secondary | ICD-10-CM

## 2012-04-02 LAB — URINALYSIS, MICROSCOPIC ONLY
Glucose, UA: NEGATIVE mg/dL
Nitrite: NEGATIVE
Specific Gravity, Urine: 1.007 (ref 1.005–1.030)
pH: 6 (ref 5.0–8.0)

## 2012-04-02 LAB — BASIC METABOLIC PANEL
BUN: 6 mg/dL (ref 6–23)
Chloride: 107 mEq/L (ref 96–112)
Creatinine, Ser: 0.83 mg/dL (ref 0.50–1.10)
Glucose, Bld: 127 mg/dL — ABNORMAL HIGH (ref 70–99)
Potassium: 4 mEq/L (ref 3.5–5.1)

## 2012-04-02 LAB — CBC
HCT: 33.8 % — ABNORMAL LOW (ref 36.0–46.0)
Hemoglobin: 11.1 g/dL — ABNORMAL LOW (ref 12.0–15.0)
MCH: 30.5 pg (ref 26.0–34.0)
MCHC: 32.8 g/dL (ref 30.0–36.0)

## 2012-04-02 NOTE — Progress Notes (Signed)
[facility] PROGRESS NOTE  [name] ZOX:[id number] DOB: [birth year] DOA: 03/31/2012 PCP: No primary provider on file.  Assessment/Plan: Dehydration  -Secondary to intractable vomiting/diarrhea  -Continue intravenous fluids  -Advance diet to cardiac Intractable vomiting/diarrhea  -Patient has not had any stools since admission -Influenza PCR--negative -Patient continues to have low-grade fever, 100.57F  -Vomiting and diarrhea have remitted  -C. difficile PCR  -Patient takes cephalexin 500 mg once a week  -Avoid antimotility agents; patient states she took Imodium prior to calling the ambulance  Sinus tachycardia  -Secondary to dehydration  -Improving with IV fluids Hypothyroidism  -Continue Synthroid  -TSH 0.431  Hypertension  -Blood pressures continue to remain soft  -Continue to hold metoprolol succinate, Benicar -Check orthostatics Fever/pyuria -Blood cultures x2 sets -Urine culture 50,000 colonies, multiple morphotypes -Repeat UA and urine culture     Family Communication:   Pt at beside Disposition Plan:   Home when medically stable      Procedures/Studies:  No results found.      Subjective: Patient is feeling better. No vomiting or diarrhea in the last 24 hours. Feels a little weak. Denies fevers, chills, chest pain, shortness breath, abdominal pain, dysuria, hematuria, dizziness. She feels like she can eat today. He tolerated clear liquids.  Objective: Filed Vitals:   04/01/12 1400 04/01/12 2130 04/02/12 0503 04/02/12 0613  BP: 91/42 122/47  104/43  Pulse: 89 96  94  Temp: 98.9 F (37.2 C) 100.5 F (38.1 C) 100 F (37.8 C) 99.2 F (37.3 C)  TempSrc: Oral Oral Oral Oral  Resp: 16 20  18   Height:      Weight:      SpO2: 97% 92% 95% 95%    Intake/Output Summary (Last 24 hours) at 04/02/12 0847 Last data filed at 04/02/12 0600  Gross per 24 hour  Intake 4413.33 ml  Output      0 ml  Net 4413.33 ml   Weight change:   Exam:   General:  Pt is alert, follows commands appropriately, not in acute distress  HEENT: No icterus, No thrush, No neck mass, [place]/AT  Cardiovascular: RRR, S1/S2, no rubs, no gallops  Respiratory: CTA bilaterally, no wheezing, no crackles, no rhonchi  Abdomen: Soft/+BS, non tender, non distended, no guarding  Extremities: No edema, No lymphangitis, No petechiae, No rashes, no synovitis  Data Reviewed: Basic Metabolic Panel:  Recent Labs Lab 03/31/12 1126 03/31/12 1945 04/01/12 0425 04/02/12 0520  NA 140  --  136 137  K 3.9  --  3.7 4.0  CL 101  --  106 107  CO2 23  --  23 24  GLUCOSE 160*  --  127* 127*  BUN 24*  --  16 6  CREATININE 1.02  --  0.84 0.83  CALCIUM 8.5  --  7.0* 7.4*  MG  --  1.5  --   --   PHOS  --  2.6  --   --    Liver Function Tests:  Recent Labs Lab 04/01/12 0425  AST 22  ALT 15  ALKPHOS 47  BILITOT 0.3  PROT 5.5*  ALBUMIN 2.8*   No results found for this basename: LIPASE, AMYLASE,  in the last 168 hours No results found for this basename: AMMONIA,  in the last 168 hours CBC:  Recent Labs Lab 03/31/12 1126 04/01/12 0425 04/02/12 0520  WBC 11.3* 7.9 7.0  NEUTROABS 10.4*  --   --   HGB 13.5 11.2* 11.1*  HCT 41.6 34.0* 33.8*  MCV 91.8 92.9 92.9  PLT 224 160 157   Cardiac Enzymes: No results found for this basename: CKTOTAL, CKMB, CKMBINDEX, TROPONINI,  in the last 168 hours BNP: No components found with this basename: POCBNP,  CBG: No results found for this basename: GLUCAP,  in the last 168 hours  Recent Results (from the past 240 hour(s))  URINE CULTURE     Status: None   Collection Time    03/31/12  6:04 PM      Result Value Range Status   Specimen Description URINE, CLEAN CATCH   Final   Special Requests NONE   Final   Culture  Setup Time 04/01/2012 03:27   Final   Colony Count 50,000 COLONIES/ML   Final   Culture     Final   Value: Multiple bacterial morphotypes present, none predominant. Suggest appropriate  recollection if clinically indicated.   Report Status 04/02/2012 FINAL   Final     Scheduled Meds: . aspirin  81 mg Oral Daily  . heparin  5,000 Units Subcutaneous Q8H  . levothyroxine  50 mcg Oral QAC breakfast  . pantoprazole  40 mg Oral Daily  . sodium chloride  3 mL Intravenous Q12H   Continuous Infusions: . dextrose 5 % and 0.45 % NaCl with KCl 20 mEq/L 125 mL/hr at 04/02/12 0348     Lauris Serviss, DO  [facility] Pager [phone]  If 7PM-7AM, please contact night-coverage [email or web address] Password [email or web address] 04/02/2012, 8:47 AM   LOS: 2 days

## 2012-04-02 NOTE — ED Provider Notes (Signed)
I saw and evaluated the patient, reviewed the resident's note and I agree with the findings and plan. Pt with nv. abd soft nt. Ivf.   Suzi Roots, MD 04/02/12 [phone]

## 2012-04-03 LAB — CBC
HCT: 32.4 % — ABNORMAL LOW (ref 36.0–46.0)
Hemoglobin: 10.8 g/dL — ABNORMAL LOW (ref 12.0–15.0)
MCHC: 33.3 g/dL (ref 30.0–36.0)
RBC: 3.54 MIL/uL — ABNORMAL LOW (ref 3.87–5.11)

## 2012-04-03 LAB — BASIC METABOLIC PANEL
BUN: 5 mg/dL — ABNORMAL LOW (ref 6–23)
Chloride: 107 mEq/L (ref 96–112)
GFR calc Af Amer: >90 mL/min (ref >90)
GFR calc non Af Amer: 82 mL/min — ABNORMAL LOW (ref >90)
Glucose, Bld: 153 mg/dL — ABNORMAL HIGH (ref 70–99)
Potassium: 3.9 mEq/L (ref 3.5–5.1)
Sodium: 140 mEq/L (ref 135–145)

## 2012-04-03 LAB — URINE CULTURE

## 2012-04-03 MED ORDER — LORAZEPAM 0.5 MG PO TABS: 0.5000 mg | ORAL_TABLET | Freq: Once | ORAL | Status: DC

## 2012-04-03 MED ORDER — ALBUTEROL SULFATE (5 MG/ML) 0.5% IN NEBU
5.0000 mg | INHALATION_SOLUTION | Freq: Once | RESPIRATORY_TRACT | Status: AC
  Administered 2012-04-03: 5 mg via RESPIRATORY_TRACT
  Filled 2012-04-03: qty 1

## 2012-04-03 NOTE — Discharge Summary (Signed)
Physician Discharge Summary  [name] ZOX:[id number] DOB: March 15, 1934 DOA: 03/31/2012  PCP: No primary provider on file.  Admit date: 03/31/2012 Discharge date: 04/03/2012  Recommendations for Outpatient Follow-up:  1. Pt will need to follow up with PCP in 1 week post discharge 2. Please obtain BMP to evaluate electrolytes and kidney function 3. Please also check CBC to evaluate Hg and Hct levels 4. Patient instructed to not restart Toprol XL or Benicar until she follows up with her primary care physician  Discharge Diagnoses:  Dehydration  -Secondary to intractable vomiting/diarrhea  -improved with intravenous fluids  -Advance diet to cardiac--tolerated without problem Intractable vomiting/diarrhea  -Patient has not had any stools or vomiting since admission  -Influenza PCR--negative  -Patient had low-grade fever, 100.28F but this subsequently resolved -Patient was afebrile for over 24 hours prior to discharge -Patient's UA does not suggest UTI, blood cultures were negative on the day of discharge -Patient remained hemodynamically stable -Vomiting and diarrhea have remitted  -C. difficile PCR--patient never had a stool to collect -Patient takes cephalexin 500 mg once a week  -Avoid antimotility agents; patient states she took Imodium prior to calling the ambulance  Sinus tachycardia  -Secondary to dehydration  -Improved with IV fluids  Hypothyroidism  -Continue Synthroid  -TSH 0.431  Hypertension  -Blood pressures remained soft initially, but subsequently improved -Continue to hold metoprolol succinate, Benicar  -Patient remains normotensive off of her antihypertensive medications -Patient was instructed to withhold her Benicar and Toprol XL until she follows up with her primary care physician -Orthostatics were negative on the day prior to discharge Fever/pyuria  -Blood cultures x2 sets--negative on the day of discharge  -Repeat UA and urine culture--did not suggest  UTI   Discharge Condition: stable  Disposition: home  Diet:cardiac Wt Readings from Last 3 Encounters:  03/31/12 84.823 kg (187 lb)    History of present illness:  78 y.o. female  With history of htn, hypothyroidism, h/o c diff colitis presenting to the ED after developing the above complaints. Reportedly this occurred after patient ate some seafood last night (03/30/12) at a restaurant. She states that she ate the flounder and salad. This morning around 2 AM she mentions that she developed nausea and subsequently had bout of emesis and diarrhea. Reportedly 3 bout of emesis/diarrhea an hour since onset. As listed above since the problem was persistent and did not improve she decided to come to the ED for further evaluation. She also endorses weakness and dry mouth.  In the ED patient received 2 L normal saline, gi cocktail, pepcid, zofran. She had some relief with this medication. Given her persistent weakness, sinus tachycardia, and dehydration we were consulted for further evaluation and recommendations    Discharge Exam: Filed Vitals:   04/03/12 0546  BP: 128/55  Pulse: 88  Temp: 98.6 F (37 C)  Resp: 17   Filed Vitals:   04/02/12 1504 04/02/12 2131 04/03/12 0323 04/03/12 0546  BP: 128/63 139/50  128/55  Pulse: 86 88  88  Temp:  98.3 F (36.8 C)  98.6 F (37 C)  TempSrc:  Oral  Oral  Resp:    17  Height:      Weight:      SpO2: 98% 97% 98% 98%   General: A&O x 3, NAD, pleasant, cooperative Cardiovascular: RRR, no rub, no gallop, no S3 Respiratory: CTAB, no wheeze, no rhonchi Abdomen:soft, nontender, nondistended, positive bowel sounds Extremities: No edema, No lymphangitis, no petechiae  Discharge Instructions  Discharge Orders   Future Orders Complete By Expires     Diet - low sodium heart healthy  As directed     Discharge instructions  As directed     Comments:      Do not restart your Toprol XL or Benicar until you follow up with your primary care  physician.  call your primary care physician for followup appointment in one week. Please see your primary care physician within one week.    Increase activity slowly  As directed         Medication List    STOP taking these medications       metoprolol succinate 100 MG 24 hr tablet  Commonly known as:  TOPROL-XL     olmesartan 20 MG tablet  Commonly known as:  BENICAR      TAKE these medications       ALPRAZolam 0.5 MG tablet  Commonly known as:  XANAX  Take 0.5 mg by mouth 3 (three) times daily as needed for sleep or anxiety.     aspirin 81 MG chewable tablet  Chew 81 mg by mouth daily.     levothyroxine 50 MCG tablet  Commonly known as:  SYNTHROID, LEVOTHROID  Take 50 mcg by mouth every morning.     multivitamin with minerals Tabs  Take 1 tablet by mouth daily.     omeprazole 20 MG capsule  Commonly known as:  PRILOSEC  Take 20 mg by mouth daily.         The results of significant diagnostics from this hospitalization (including imaging, microbiology, ancillary and laboratory) are listed below for reference.    Significant Diagnostic Studies: No results found.   Microbiology: Recent Results (from the past 240 hour(s))  URINE CULTURE     Status: None   Collection Time    03/31/12  6:04 PM      Result Value Range Status   Specimen Description URINE, CLEAN CATCH   Final   Special Requests NONE   Final   Culture  Setup Time 04/01/2012 03:27   Final   Colony Count 50,000 COLONIES/ML   Final   Culture     Final   Value: Multiple bacterial morphotypes present, none predominant. Suggest appropriate recollection if clinically indicated.   Report Status 04/02/2012 FINAL   Final  CULTURE, BLOOD (ROUTINE X 2)     Status: None   Collection Time    04/02/12 10:15 AM      Result Value Range Status   Specimen Description BLOOD LEFT ANTECUBITAL   Final   Special Requests BOTTLES DRAWN AEROBIC AND ANAEROBIC 5CC   Final   Culture  Setup Time 04/02/2012 13:41   Final    Culture     Final   Value:        BLOOD CULTURE RECEIVED NO GROWTH TO DATE CULTURE WILL BE HELD FOR 5 DAYS BEFORE ISSUING A FINAL NEGATIVE REPORT   Report Status PENDING   Incomplete  CULTURE, BLOOD (ROUTINE X 2)     Status: None   Collection Time    04/02/12 10:20 AM      Result Value Range Status   Specimen Description BLOOD EFT HAND   Final   Special Requests BOTTLES DRAWN AEROBIC AND ANAEROBIC 5CC   Final   Culture  Setup Time 04/02/2012 13:41   Final   Culture     Final   Value:        BLOOD CULTURE RECEIVED NO GROWTH TO  DATE CULTURE WILL BE HELD FOR 5 DAYS BEFORE ISSUING A FINAL NEGATIVE REPORT   Report Status PENDING   Incomplete  URINE CULTURE     Status: None   Collection Time    04/02/12 11:15 AM      Result Value Range Status   Specimen Description URINE, RANDOM   Final   Special Requests NONE   Final   Culture  Setup Time 04/02/2012 16:31   Final   Colony Count 15,000 COLONIES/ML   Final   Culture     Final   Value: Multiple bacterial morphotypes present, none predominant. Suggest appropriate recollection if clinically indicated.   Report Status 04/03/2012 FINAL   Final     Labs: Basic Metabolic Panel:  Recent Labs Lab 03/31/12 1126 03/31/12 1945 04/01/12 0425 04/02/12 0520 04/03/12 0452  NA 140  --  136 137 140  K 3.9  --  3.7 4.0 3.9  CL 101  --  106 107 107  CO2 23  --  23 24 24   GLUCOSE 160*  --  127* 127* 153*  BUN 24*  --  16 6 5*  CREATININE 1.02  --  0.84 0.83 0.72  CALCIUM 8.5  --  7.0* 7.4* 8.1*  MG  --  1.5  --   --   --   PHOS  --  2.6  --   --   --    Liver Function Tests:  Recent Labs Lab 04/01/12 0425  AST 22  ALT 15  ALKPHOS 47  BILITOT 0.3  PROT 5.5*  ALBUMIN 2.8*   No results found for this basename: LIPASE, AMYLASE,  in the last 168 hours No results found for this basename: AMMONIA,  in the last 168 hours CBC:  Recent Labs Lab 03/31/12 1126 04/01/12 0425 04/02/12 0520 04/03/12 0452  WBC 11.3* 7.9 7.0 9.1   NEUTROABS 10.4*  --   --   --   HGB 13.5 11.2* 11.1* 10.8*  HCT 41.6 34.0* 33.8* 32.4*  MCV 91.8 92.9 92.9 91.5  PLT 224 160 157 161   Cardiac Enzymes: No results found for this basename: CKTOTAL, CKMB, CKMBINDEX, TROPONINI,  in the last 168 hours BNP: No components found with this basename: POCBNP,  CBG: No results found for this basename: GLUCAP,  in the last 168 hours  Time coordinating discharge:  Greater than 30 minutes  Signed:  Linkin Vizzini, DO [facility] Pager: [phone] 04/03/2012, 2:23 PM

## 2012-04-03 NOTE — Progress Notes (Signed)
Pt complaining of increased SOB and noisy breathing tonight when she lies on her side. Pt's O2 sats on RA were 98%. Pt's lungs sounded diminished/rhonchi in R upper and lower lobes. Clear in all other lobes. Paged Merdis Delay, NP on call and received new orders for pt to have a one time breathing tx and one time dose of Ativan 0.5 mg PO. Will continue to monitor pt.

## 2012-04-03 NOTE — Progress Notes (Addendum)
Pt does not want to take Ativan PO because she's afraid that it will have the opposite effect on her. Respiratory therapist confirmed that pt's noisy breathing sounds like upper airway congestion. Pt received breathing tx.  Will continue to monitor pt and notify NP with any changes.

## 2012-04-07 ENCOUNTER — Telehealth (HOSPITAL_COMMUNITY): Payer: Self-pay | Admitting: *Deleted

## 2012-04-08 LAB — CULTURE, BLOOD (ROUTINE X 2): Culture: NO GROWTH

## 2012-04-09 LAB — CULTURE, BLOOD (ROUTINE X 2)

## 2013-01-01 ENCOUNTER — Other Ambulatory Visit: Payer: Self-pay

## 2013-01-01 DIAGNOSIS — Z1231 Encounter for screening mammogram for malignant neoplasm of breast: Secondary | ICD-10-CM

## 2013-02-05 ENCOUNTER — Ambulatory Visit: Payer: Medicare Other

## 2013-02-07 ENCOUNTER — Ambulatory Visit: Payer: Medicare Other

## 2013-03-11 ENCOUNTER — Ambulatory Visit: Payer: Medicare Other

## 2013-04-22 ENCOUNTER — Other Ambulatory Visit: Payer: Self-pay

## 2013-04-22 ENCOUNTER — Ambulatory Visit
Admission: RE | Admit: 2013-04-22 | Discharge: 2013-04-22 | Disposition: A | Discharge status: Home / Self Care | Payer: Commercial Managed Care - HMO | Source: Ambulatory Visit

## 2013-04-22 ENCOUNTER — Ambulatory Visit
Admission: RE | Admit: 2013-04-22 | Discharge: 2013-04-22 | Disposition: A | Discharge status: Home / Self Care | Payer: Self-pay | Source: Ambulatory Visit

## 2013-04-22 DIAGNOSIS — Z1231 Encounter for screening mammogram for malignant neoplasm of breast: Secondary | ICD-10-CM

## 2013-04-24 ENCOUNTER — Other Ambulatory Visit: Payer: Self-pay | Admitting: Obstetrics and Gynecology

## 2013-04-24 DIAGNOSIS — R928 Other abnormal and inconclusive findings on diagnostic imaging of breast: Secondary | ICD-10-CM

## 2013-04-29 ENCOUNTER — Ambulatory Visit
Admission: RE | Admit: 2013-04-29 | Discharge: 2013-04-29 | Disposition: A | Discharge status: Home / Self Care | Payer: Commercial Managed Care - HMO | Source: Ambulatory Visit | Attending: Obstetrics and Gynecology | Admitting: Obstetrics and Gynecology

## 2013-04-29 ENCOUNTER — Other Ambulatory Visit: Payer: Self-pay | Admitting: Obstetrics and Gynecology

## 2013-04-29 DIAGNOSIS — R928 Other abnormal and inconclusive findings on diagnostic imaging of breast: Secondary | ICD-10-CM

## 2013-04-29 DIAGNOSIS — N6310 Unspecified lump in the right breast, unspecified quadrant: Secondary | ICD-10-CM

## 2013-05-03 ENCOUNTER — Inpatient Hospital Stay: Admission: RE | Admit: 2013-05-03 | Payer: Commercial Managed Care - HMO | Source: Ambulatory Visit

## 2013-05-08 ENCOUNTER — Other Ambulatory Visit: Payer: Self-pay | Admitting: Obstetrics and Gynecology

## 2013-05-08 ENCOUNTER — Ambulatory Visit
Admission: RE | Admit: 2013-05-08 | Discharge: 2013-05-08 | Disposition: A | Discharge status: Home / Self Care | Payer: Commercial Managed Care - HMO | Source: Ambulatory Visit | Attending: Obstetrics and Gynecology | Admitting: Obstetrics and Gynecology

## 2013-05-08 DIAGNOSIS — N6310 Unspecified lump in the right breast, unspecified quadrant: Secondary | ICD-10-CM

## 2013-10-03 ENCOUNTER — Other Ambulatory Visit: Payer: Self-pay | Admitting: Obstetrics and Gynecology

## 2013-10-03 DIAGNOSIS — N63 Unspecified lump in unspecified breast: Secondary | ICD-10-CM

## 2013-11-07 ENCOUNTER — Ambulatory Visit
Admission: RE | Admit: 2013-11-07 | Discharge: 2013-11-07 | Disposition: A | Discharge status: Home / Self Care | Payer: Commercial Managed Care - HMO | Source: Ambulatory Visit | Attending: Obstetrics and Gynecology | Admitting: Obstetrics and Gynecology

## 2013-11-07 ENCOUNTER — Encounter (INDEPENDENT_AMBULATORY_CARE_PROVIDER_SITE_OTHER): Payer: Self-pay

## 2013-11-07 DIAGNOSIS — N63 Unspecified lump in unspecified breast: Secondary | ICD-10-CM

## 2014-02-21 HISTORY — PX: MASTECTOMY: SHX3

## 2014-03-27 DIAGNOSIS — Z08 Encounter for follow-up examination after completed treatment for malignant neoplasm: Secondary | ICD-10-CM | POA: Diagnosis not present

## 2014-03-27 DIAGNOSIS — D225 Melanocytic nevi of trunk: Secondary | ICD-10-CM | POA: Diagnosis not present

## 2014-03-27 DIAGNOSIS — L814 Other melanin hyperpigmentation: Secondary | ICD-10-CM | POA: Diagnosis not present

## 2014-03-27 DIAGNOSIS — Z85828 Personal history of other malignant neoplasm of skin: Secondary | ICD-10-CM | POA: Diagnosis not present

## 2014-04-18 ENCOUNTER — Other Ambulatory Visit: Payer: Self-pay | Admitting: Internal Medicine

## 2014-04-18 DIAGNOSIS — N63 Unspecified lump in unspecified breast: Secondary | ICD-10-CM

## 2014-05-13 ENCOUNTER — Other Ambulatory Visit: Payer: Self-pay | Admitting: Internal Medicine

## 2014-05-13 ENCOUNTER — Ambulatory Visit
Admission: RE | Admit: 2014-05-13 | Discharge: 2014-05-13 | Disposition: A | Discharge status: Home / Self Care | Payer: Commercial Managed Care - HMO | Source: Ambulatory Visit | Attending: Internal Medicine | Admitting: Internal Medicine

## 2014-05-13 DIAGNOSIS — N63 Unspecified lump in unspecified breast: Secondary | ICD-10-CM

## 2014-05-13 IMAGING — MG MM DIAG BREAST TOMO BILATERAL
8 series · 8 of 8 positions shown · non-contrast
Comparison: [DATE], [DATE], [DATE], [DATE],
additional prior studies dating back to [DATE]

CLINICAL DATA: 79-year-old female seen for follow-up of a probably
benign right breast mass.

EXAM:
DIGITAL DIAGNOSTIC BILATERAL MAMMOGRAM WITH 3D TOMOSYNTHESIS WITH
CAD
ULTRASOUND RIGHT BREAST

[R CC]
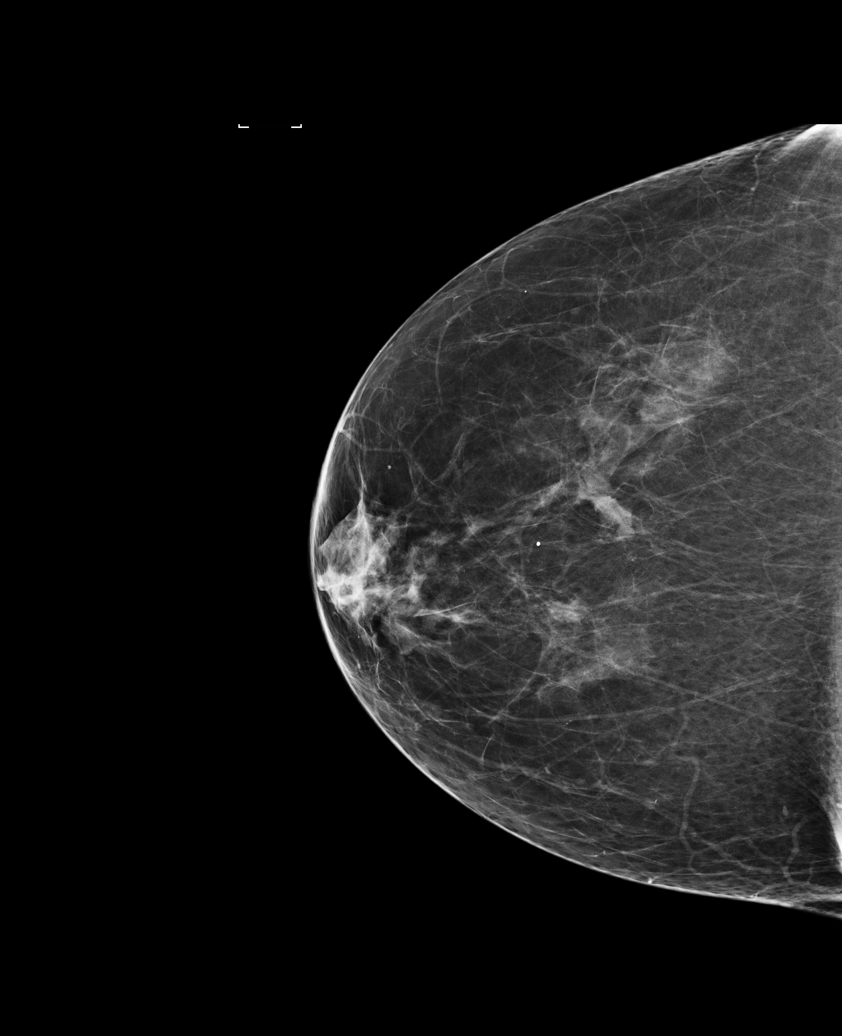

[L MLO]
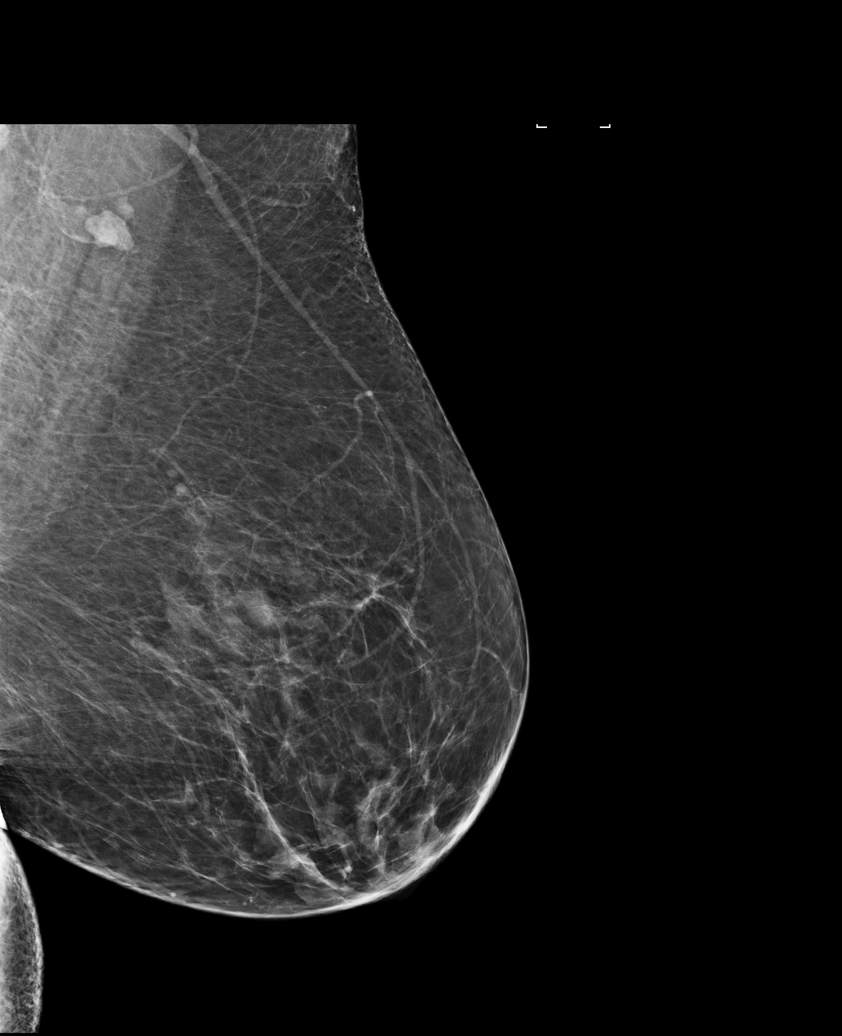

[L CC]
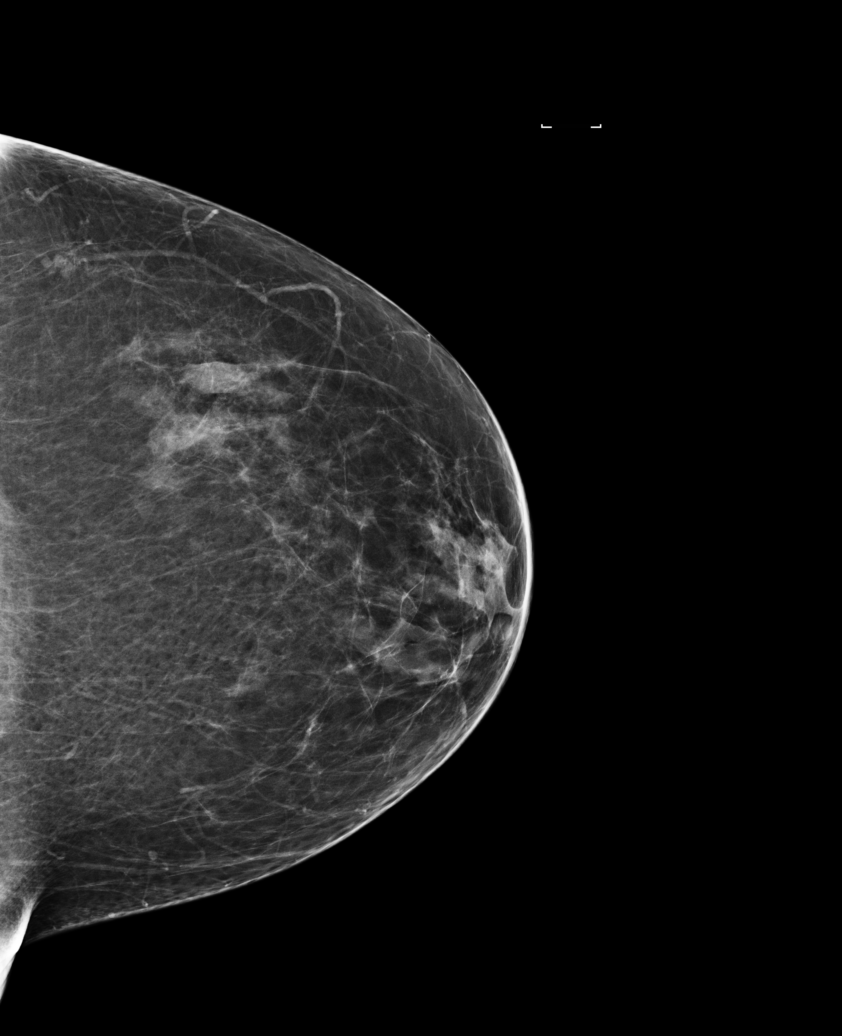

[R MLO]
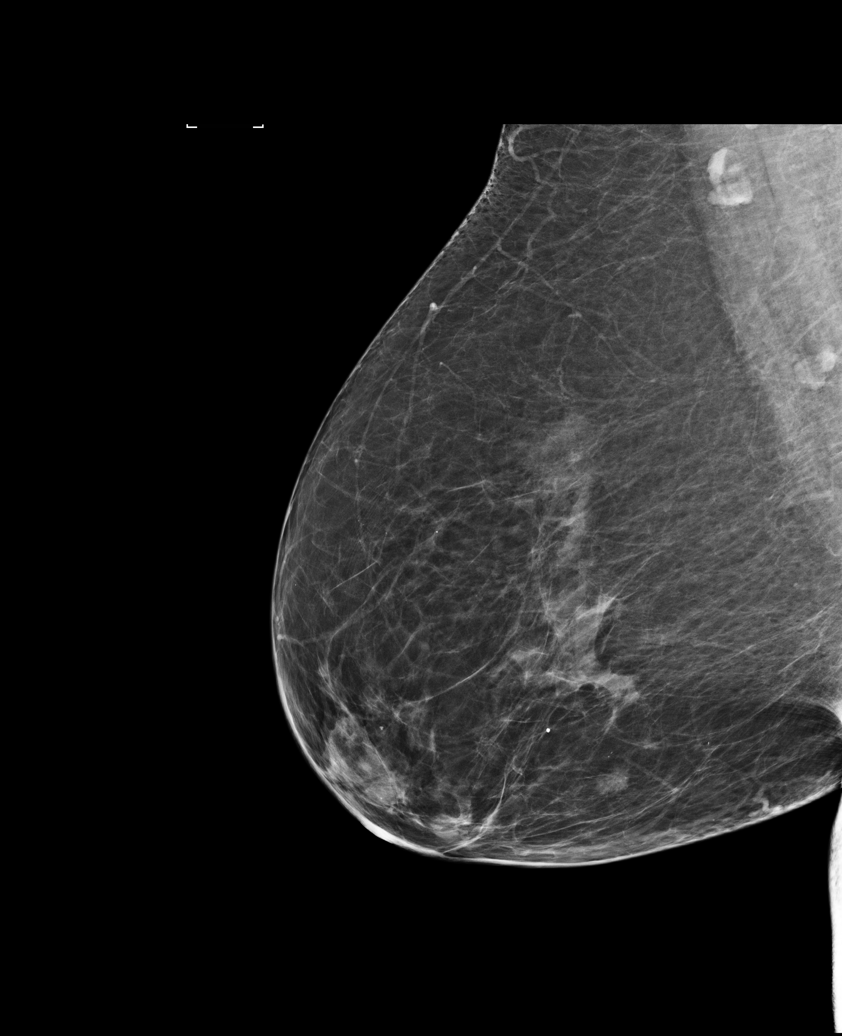

[R MLO tomo]
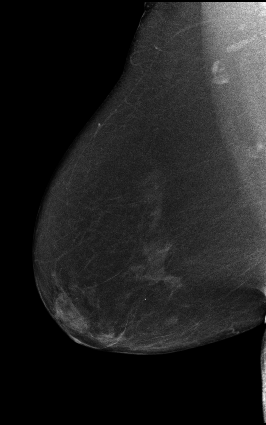

[L MLO tomo]
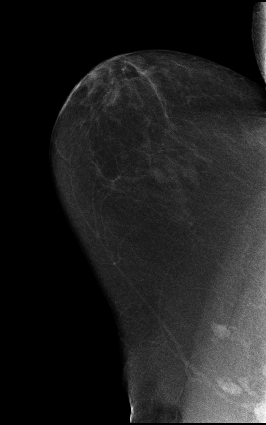

[L CC tomo]
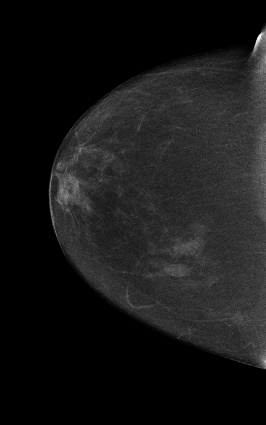

[R CC tomo]
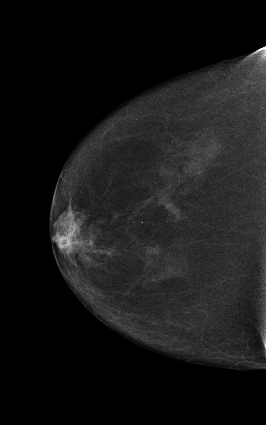

[8 of 8 positions shown; findings below may reference images not displayed]

ACR Breast Density Category b: There are scattered areas of
fibroglandular density.
FINDINGS: There has been interval enlargement of the previously noted mass
within the inferior right breast, middle depth. On today's exam, the
mass demonstrates irregular margins. No additional suspicious mass,
calcifications, or other abnormality is identified within either
breast.

Mammographic images were processed with CAD.

On physical exam, no discrete mass is felt within the area of
concern within the lower, inner right breast.

Targeted ultrasound is performed, showing an irregular, mixed
echogenicity mass at 6 o'clock, 3-4 cm from the nipple measuring 8 x
6 x 3 mm. An additional, irregular, hypoechoic mass is identified at
7 o'clock, 4 cm from the nipple measuring 6 x 5 x 6 mm. A third,
oval, predominantly circumscribed, hypoechoic mass is identified at
630, 4 cm from the nipple measuring 3 x 2 x 3 mm. This mass is
thought to correspond to the mass seen on ultrasound dated
[DATE] and does not appear significantly changed in size from
prior exam. No abnormal axillary lymph nodes are identified.
IMPRESSION: Indeterminate right breast masses.

RECOMMENDATION:
Ultrasound-guided biopsies of the irregular masses at 6 o'clock, 3-4
cm from the nipple and 7 o'clock, 4 cm from the nipple are
recommended. If neither biopsy corresponds to the mammographic
abnormality, attempt at stereotactic guided biopsy is recommended.
If either biopsy demonstrates malignancy, biopsy of the third mass
at 630, 4 cm from the nipple may be considered.

I have discussed the findings and recommendations with the patient.
Results were also provided in writing at the conclusion of the
visit. If applicable, a reminder letter will be sent to the patient
regarding the next appointment.

BI-RADS CATEGORY  4: Suspicious.

## 2014-05-15 ENCOUNTER — Other Ambulatory Visit: Payer: Self-pay | Admitting: Internal Medicine

## 2014-05-15 DIAGNOSIS — N63 Unspecified lump in unspecified breast: Secondary | ICD-10-CM

## 2014-05-20 ENCOUNTER — Other Ambulatory Visit: Payer: Commercial Managed Care - HMO

## 2014-05-21 ENCOUNTER — Other Ambulatory Visit: Payer: Commercial Managed Care - HMO

## 2014-05-21 ENCOUNTER — Ambulatory Visit
Admission: RE | Admit: 2014-05-21 | Discharge: 2014-05-21 | Disposition: A | Discharge status: Home / Self Care | Payer: Commercial Managed Care - HMO | Source: Ambulatory Visit | Attending: Internal Medicine | Admitting: Internal Medicine

## 2014-05-21 DIAGNOSIS — D0511 Intraductal carcinoma in situ of right breast: Secondary | ICD-10-CM | POA: Diagnosis not present

## 2014-05-21 DIAGNOSIS — C50511 Malignant neoplasm of lower-outer quadrant of right female breast: Secondary | ICD-10-CM | POA: Diagnosis not present

## 2014-05-21 DIAGNOSIS — N63 Unspecified lump in unspecified breast: Secondary | ICD-10-CM

## 2014-05-21 DIAGNOSIS — C50811 Malignant neoplasm of overlapping sites of right female breast: Secondary | ICD-10-CM | POA: Diagnosis not present

## 2014-05-22 ENCOUNTER — Other Ambulatory Visit: Payer: Self-pay | Admitting: Internal Medicine

## 2014-05-22 DIAGNOSIS — C50911 Malignant neoplasm of unspecified site of right female breast: Secondary | ICD-10-CM

## 2014-05-26 ENCOUNTER — Ambulatory Visit
Admission: RE | Admit: 2014-05-26 | Discharge: 2014-05-26 | Disposition: A | Discharge status: Home / Self Care | Payer: Commercial Managed Care - HMO | Source: Ambulatory Visit | Attending: Internal Medicine | Admitting: Internal Medicine

## 2014-05-26 DIAGNOSIS — C50911 Malignant neoplasm of unspecified site of right female breast: Secondary | ICD-10-CM | POA: Diagnosis not present

## 2014-05-26 MED ORDER — GADOBENATE DIMEGLUMINE 529 MG/ML IV SOLN
17.0000 mL | Freq: Once | INTRAVENOUS | Status: AC | PRN
Start: 2014-05-26 — End: 2014-05-26
  Administered 2014-05-26: 17 mL via INTRAVENOUS

## 2014-05-27 ENCOUNTER — Other Ambulatory Visit: Payer: Self-pay | Admitting: Internal Medicine

## 2014-05-27 DIAGNOSIS — R928 Other abnormal and inconclusive findings on diagnostic imaging of breast: Secondary | ICD-10-CM

## 2014-06-02 ENCOUNTER — Ambulatory Visit
Admission: RE | Admit: 2014-06-02 | Discharge: 2014-06-02 | Disposition: A | Discharge status: Home / Self Care | Payer: Commercial Managed Care - HMO | Source: Ambulatory Visit | Attending: Internal Medicine | Admitting: Internal Medicine

## 2014-06-02 DIAGNOSIS — R928 Other abnormal and inconclusive findings on diagnostic imaging of breast: Secondary | ICD-10-CM

## 2014-06-02 DIAGNOSIS — C50911 Malignant neoplasm of unspecified site of right female breast: Secondary | ICD-10-CM | POA: Diagnosis not present

## 2014-06-02 DIAGNOSIS — D0501 Lobular carcinoma in situ of right breast: Secondary | ICD-10-CM | POA: Diagnosis not present

## 2014-06-02 MED ORDER — GADOBENATE DIMEGLUMINE 529 MG/ML IV SOLN
17.0000 mL | Freq: Once | INTRAVENOUS | Status: AC | PRN
  Administered 2014-06-02: 17 mL via INTRAVENOUS

## 2014-06-04 DIAGNOSIS — C50911 Malignant neoplasm of unspecified site of right female breast: Secondary | ICD-10-CM | POA: Diagnosis not present

## 2014-06-05 ENCOUNTER — Telehealth: Payer: Self-pay | Admitting: Genetic Counselor

## 2014-06-05 NOTE — Telephone Encounter (Signed)
GENETIC APPT-S/W PATIENT AND GAVE GENETIC APPT FOR 04/21 @ 1:30 W/GENETIC COUNSELOR. REFERRING DR. Excell Seltzer

## 2014-06-06 ENCOUNTER — Inpatient Hospital Stay: Admission: RE | Admit: 2014-06-06 | Payer: Commercial Managed Care - HMO | Source: Ambulatory Visit

## 2014-06-12 ENCOUNTER — Ambulatory Visit (HOSPITAL_BASED_OUTPATIENT_CLINIC_OR_DEPARTMENT_OTHER): Payer: Commercial Managed Care - HMO | Admitting: Genetic Counselor

## 2014-06-12 ENCOUNTER — Other Ambulatory Visit: Payer: Commercial Managed Care - HMO

## 2014-06-12 DIAGNOSIS — Z800 Family history of malignant neoplasm of digestive organs: Secondary | ICD-10-CM | POA: Insufficient documentation

## 2014-06-12 DIAGNOSIS — C50811 Malignant neoplasm of overlapping sites of right female breast: Secondary | ICD-10-CM

## 2014-06-12 DIAGNOSIS — C50911 Malignant neoplasm of unspecified site of right female breast: Secondary | ICD-10-CM | POA: Diagnosis not present

## 2014-06-12 DIAGNOSIS — C50511 Malignant neoplasm of lower-outer quadrant of right female breast: Secondary | ICD-10-CM | POA: Diagnosis not present

## 2014-06-12 DIAGNOSIS — Z8041 Family history of malignant neoplasm of ovary: Secondary | ICD-10-CM | POA: Diagnosis not present

## 2014-06-12 DIAGNOSIS — Z808 Family history of malignant neoplasm of other organs or systems: Secondary | ICD-10-CM

## 2014-06-12 DIAGNOSIS — Z315 Encounter for genetic counseling: Secondary | ICD-10-CM

## 2014-06-12 DIAGNOSIS — Z803 Family history of malignant neoplasm of breast: Secondary | ICD-10-CM

## 2014-06-12 NOTE — Progress Notes (Signed)
[facility] Patient Visit  REFERRING PROVIDER: Prince Solian, MD [address] [place], [place] [address]  PRIMARY PROVIDER:  Tivis Ringer, MD  PRIMARY REASON FOR VISIT:  1. Breast cancer, right breast   2. Family history of malignant neoplasm of gastrointestinal tract   3. Family history of malignant neoplasm of breast   4. Family history of malignant neoplasm of ovary     HISTORY OF PRESENT ILLNESS:   [name], a 80 y.o. female, was seen for a [facility] cancer genetics consultation at the request of Dr. Dagmar Hait due to a personal and family history of cancer.  [name] presents to clinic today to discuss the possibility of a hereditary predisposition to cancer, genetic testing, and to further clarify her future cancer risks, as well as potential cancer risks for family members.   CANCER HISTORY:  [name] was recently diagnosed with multicentric right, ER+/PR+, breast cancer ([facility]) at the age 66. She is currently deciding on a treatment plan with her oncology providers and would like to use genetic test results for surgical decisions. She has no prior history of cancer. She had a TAH in 1990 for bleeding and believes her ovaries currently remain intact.   Past Medical History  Diagnosis Date   Hypertension    Thyroid disease    C. difficile colitis     Past Surgical History  Procedure Laterality Date   Abdominal hysterectomy     Tubal ligation     Cholecystectomy     Tonsillectomy     Eye surgery      History   Social History   Marital Status: Married    Spouse Name: N/A   Number of Children: N/A   Years of Education: N/A   Social History Main Topics   Smoking status: Never Smoker    Smokeless tobacco: Not on file   Alcohol Use: No   Drug Use: No   Sexual Activity: Not on file   Other Topics Concern   Not on file   Social History Narrative     FAMILY HISTORY:  During the visit,  a 4-generation pedigree was obtained. A copy of the pedigree with be scanned into Epic under the Media tab. Significant family history diagnoses include the following: Family History  Problem Relation Age of Onset   Cancer Sister 96    breast   Cancer Brother 29    colorectal cancer   Cancer Sister 55    atyipical breast hyperplasia -  mastectomy   Cancer Other 8    niece (daughter of sister with atypical breast cells) with breast cancer    Cancer Other 58    nephew (son of brother with colorectal ca) with coloreactal cancer   Cancer Other     great niece (brother's daughter's daughter) with ovarian cancer   [name]'s ancestry is of Caucasian descent. There is no known Jewish ancestry or consanguinity.  GENETIC COUNSELING ASSESSMENT:  [name] is a 80 y.o. female with a personal and family history of cancer suggestive of a hereditary predisposition to cancer. We, therefore, discussed and recommended the following at today's visit.   DISCUSSION / PLAN:  We reviewed the characteristics, features and inheritance patterns of hereditary cancer syndromes. We also discussed genetic testing, including the appropriate family members to test, the process of testing, insurance coverage and turn-around-time for results. We discussed the implications of a negative, positive and/or variant of uncertain significant result.  We recommended Ms. Mckendry pursue genetic testing for the BRCAPlus gene panel in order to have genetic test results in a timely manner for surgical decisions. The BRCAPlus gene panel analyzes the BRCA1, BRCA2, PALB2, CDH1, PTEN and TP53 genes for mutations and results will be available within 2 weeks time. If this gene testing is negative, based on her personal and family history, we recommended reflex genetic testing for the OvaNext gene panel. The OvaNext gene panel offered by [employer] includes sequencing and rearrangement analysis for the following 24 genes:ATM,  BARD1, BRCA1, BRCA2, BRIP1, CDH1, CHEK2, EPCAM, MLH1, MRE11A, MSH2, MSH6, MUTYH, NBN, NF1, PALB2, PMS2, PTEN, RAD50, RAD51C, RAD51D, SMARCA4, STK11, and TP53. If the OvaNext gene panel is pursued as a reflex test, these results will take an additional 2 weeks.   Based on our above recommendation, [name] wished to pursue genetic testing and the blood sample was drawn and will be sent to [facility] for analysis. Once results are available, they will be disclosed by telephone to Ms. Mccluney, as will any additional recommendations warranted by these results.   Lastly, we encouraged [name] to remain in contact with cancer genetics annually so that we can continuously update the family history and inform her of any changes in cancer genetics and testing that may be of benefit for this family.   Ms.  Mcclain questions were answered to her satisfaction today. Our contact information was provided should additional questions or concerns arise. Thank you for the referral and allowing [place] to share in the care of your patient.   Catherine A. Fine, MS, CGC Certified Psychologist, [email or web address] phone: [phone]  The patient was seen for a total of 45 minutes in face-to-face genetic counseling.  This patient was discussed with Dr. Jana Hakim who agrees with the above.    ______________________________________________________________________ For Office Staff:  Number of people involved in session including genetic counselor: 2 Was an intern or student involved with case: not applicable

## 2014-06-16 DIAGNOSIS — C50911 Malignant neoplasm of unspecified site of right female breast: Secondary | ICD-10-CM | POA: Diagnosis not present

## 2014-06-26 ENCOUNTER — Encounter: Payer: Self-pay | Admitting: Genetic Counselor

## 2014-06-26 DIAGNOSIS — Z800 Family history of malignant neoplasm of digestive organs: Secondary | ICD-10-CM

## 2014-06-26 DIAGNOSIS — C50911 Malignant neoplasm of unspecified site of right female breast: Secondary | ICD-10-CM

## 2014-06-26 DIAGNOSIS — Z803 Family history of malignant neoplasm of breast: Secondary | ICD-10-CM

## 2014-06-26 DIAGNOSIS — Z8041 Family history of malignant neoplasm of ovary: Secondary | ICD-10-CM

## 2014-06-26 NOTE — Progress Notes (Signed)
Ms. Dowda recently had cancer genetic counseling at [facility] on 06/12/2014.Marland Kitchen At that time, it was recommended she pursue genetic testing. Her BRCAplus gene panel test, which was performed at [facility], has returned and is negative for mutations. The BRCAPlus gene panel offered by [facility] includes sequencing and rearrangement analysis for the following 6 genes: BRCA1, BRCA2, CDH1, PALB2, PTEN, and TP53. These results were disclosed to her today. Per her request, reflex testing for the remaining genes on the OvaNext gene panel at [facility] was initiated. The OvaNext gene panel offered by [employer] includes sequencing and rearrangement analysis for the following 24 genes:ATM, BARD1, BRCA1, BRCA2, BRIP1, CDH1, CHEK2, EPCAM, MLH1, MRE11A, MSH2, MSH6, MUTYH, NBN, NF1, PALB2, PMS2, PTEN, RAD50, RAD51C, RAD51D, SMARCA4, STK11, and TP53. Results for the remaining genes on the gene panel should be available in 2-3 more weeks and we will contact her to discuss these results and recommendations warranted by these results, once available.

## 2014-07-09 DIAGNOSIS — H5213 Myopia, bilateral: Secondary | ICD-10-CM | POA: Diagnosis not present

## 2014-07-09 DIAGNOSIS — H5210 Myopia, unspecified eye: Secondary | ICD-10-CM | POA: Diagnosis not present

## 2014-07-15 ENCOUNTER — Encounter: Payer: Self-pay | Admitting: Hematology and Oncology

## 2014-07-15 ENCOUNTER — Ambulatory Visit (HOSPITAL_BASED_OUTPATIENT_CLINIC_OR_DEPARTMENT_OTHER): Payer: Commercial Managed Care - HMO | Admitting: Hematology and Oncology

## 2014-07-15 ENCOUNTER — Ambulatory Visit: Payer: Commercial Managed Care - HMO

## 2014-07-15 ENCOUNTER — Encounter: Payer: Self-pay | Admitting: *Deleted

## 2014-07-15 ENCOUNTER — Other Ambulatory Visit: Payer: Self-pay | Admitting: *Deleted

## 2014-07-15 VITALS — BP 143/65 | HR 85 | Temp 97.5°F | Resp 18 | Ht 60.5 in | Wt 184.7 lb

## 2014-07-15 DIAGNOSIS — Z8041 Family history of malignant neoplasm of ovary: Secondary | ICD-10-CM | POA: Diagnosis not present

## 2014-07-15 DIAGNOSIS — D0501 Lobular carcinoma in situ of right breast: Secondary | ICD-10-CM

## 2014-07-15 DIAGNOSIS — C50911 Malignant neoplasm of unspecified site of right female breast: Secondary | ICD-10-CM

## 2014-07-15 DIAGNOSIS — Z803 Family history of malignant neoplasm of breast: Secondary | ICD-10-CM | POA: Diagnosis not present

## 2014-07-15 DIAGNOSIS — Z800 Family history of malignant neoplasm of digestive organs: Secondary | ICD-10-CM

## 2014-07-15 NOTE — Assessment & Plan Note (Signed)
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  Pathology and radiology review:Discussed with the patient, the details of pathology including the type of breast cancer,the clinical staging, the significance of ER, PR and HER-2/neu receptors and the implications for treatment. After reviewing the pathology in detail, we proceeded to discuss the different treatment options between surgery, radiation, and antiestrogen therapies.  Recommendation: 1. Agree with the current treatment plan of bilateral mastectomies with or without reconstruction 2. Adjuvant antiestrogen therapy with anastrozole 1 mg daily for 5 years  Anastrozole counseling:We discussed the risks and benefits of anti-estrogen therapy with aromatase inhibitors. These include but not limited to insomnia, hot flashes, mood changes, vaginal dryness, bone density loss, and weight gain. Although rare, serious side effects including endometrial cancer, risk of blood clots were also discussed. We strongly believe that the benefits far outweigh the risks. Patient understands these risks and consented to starting treatment. Planned treatment duration is 5 years.

## 2014-07-15 NOTE — Progress Notes (Signed)
Met with pt during new pt visit with Dr. Lindi Adie. Gave navigation resources and contact information. Discussed care plan summary and need for pt to return after she has had surgery. Encourage pt to call with needs or questions. Received verbal understanding.

## 2014-07-15 NOTE — Progress Notes (Signed)
[facility] CONSULT NOTE  Patient Care Team: Prince Solian, MD as PCP - General (Internal Medicine)  CHIEF COMPLAINTS/PURPOSE OF CONSULTATION:  Newly diagnosed breast cancer  HISTORY OF PRESENTING ILLNESS:  [name] 80 y.o. female is here because of recent diagnosis of right breast cancer. She had a routine screening mammogram that showed worsening of the breast changes. She then underwent additional imaging studies an ultrasound that showed distortion of the breast. Underwent a biopsy on 05/21/2014 which showed that at 7:00 she had invasive lobular cancer. This was ER 99%. 30% Fibrous and HER-2 Negative. At 6:00 Showed Invasive Ductal Cancer ER 100% PR 86% Ki-67 15% HER-2 Negative. She Had a Second Biopsy in 06/02/2014 in the Right Breast Retroareolar Area Which Showed LCIS. This Is Because Breast MRI Showed Abnormality in This Area. She Is Here by Herself to Discuss the Treatment Plan.  I reviewed her records extensively and collaborated the history with the patient.  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 Hypertension   . Thyroid disease   . C. difficile colitis     SURGICAL HISTORY: Past Surgical History  Procedure Laterality Date  . Abdominal hysterectomy    . Tubal ligation    . Cholecystectomy    . Tonsillectomy    . Eye surgery      SOCIAL HISTORY: History   Social History  . Marital Status: Married    Spouse Name: N/A  . Number of Children: N/A  . Years of Education: N/A   Occupational History  . Not on file.   Social History Main Topics  . Smoking status: Never Smoker    . Smokeless tobacco: Not on file  . Alcohol Use: No  . Drug Use: No  . Sexual Activity: Not on file   Other Topics Concern  . Not on file   Social History Narrative    FAMILY HISTORY: Family History  Problem Relation Age of Onset  . Cancer Sister 78    breast  . Cancer Brother 56    colorectal cancer  . Cancer Sister 69    atyipical breast hyperplasia -  mastectomy  . Cancer Other 6    niece (daughter of sister with atypical breast cells) with breast cancer   . Cancer Other 57    nephew (son of brother with colorectal ca) with coloreactal cancer  . Cancer Other     great niece (brother's daughter's daughter) with ovarian cancer    ALLERGIES:  is allergic to iodine and sulfa antibiotics.  MEDICATIONS:  Current Outpatient Prescriptions  Medication Sig Dispense Refill  . ALPRAZolam (XANAX) 0.5 MG tablet Take 0.5 mg by mouth 3 (three) times daily as needed for sleep or anxiety.    Marland Kitchen aspirin 81 MG chewable tablet Chew 81 mg by mouth daily.    Marland Kitchen levothyroxine (SYNTHROID, LEVOTHROID) 50 MCG tablet Take 50 mcg by mouth every morning.    . Multiple Vitamin (MULTIVITAMIN WITH MINERALS) TABS Take 1 tablet by mouth daily.    Marland Kitchen omeprazole (PRILOSEC) 20 MG capsule Take 20 mg by mouth daily.     No current facility-administered  medications for this visit.    REVIEW OF SYSTEMS:   Constitutional: Denies fevers, chills or abnormal night sweats Eyes: Denies blurriness of vision, double vision or watery eyes Ears, nose, mouth, throat, and face: Denies mucositis or sore throat Respiratory: Denies cough, dyspnea or wheezes Cardiovascular: Denies palpitation, chest discomfort or lower extremity swelling Gastrointestinal:  Denies nausea, heartburn or change in bowel habits Skin: Denies abnormal skin rashes Lymphatics: Denies new lymphadenopathy or easy bruising Neurological:Denies numbness, tingling or new weaknesses Behavioral/Psych: Mood is stable, no new changes  Breast:  Denies  any palpable lumps or discharge All other systems were reviewed with the patient and are negative.  PHYSICAL EXAMINATION: ECOG PERFORMANCE STATUS: 1 - Symptomatic but completely ambulatory  Filed Vitals:   07/15/14 1319  BP: 143/65  Pulse: 85  Temp: 97.5 F (36.4 C)  Resp: 18   Filed Weights   07/15/14 1319  Weight: 184 lb 11.2 oz (83.779 kg)    GENERAL:alert, no distress and comfortable SKIN: skin color, texture, turgor are normal, no rashes or significant lesions EYES: normal, conjunctiva are pink and non-injected, sclera clear OROPHARYNX:no exudate, no erythema and lips, buccal mucosa, and tongue normal  NECK: supple, thyroid normal size, non-tender, without nodularity LYMPH:  no palpable lymphadenopathy in the cervical, axillary or inguinal LUNGS: clear to auscultation and percussion with normal breathing effort HEART: regular rate & rhythm and no murmurs and no lower extremity edema ABDOMEN:abdomen soft, non-tender and normal bowel sounds Musculoskeletal:no cyanosis of digits and no clubbing  PSYCH: alert & oriented x 3 with fluent speech NEURO: no focal motor/sensory deficits BREAST: No palpable nodules in breast. No palpable axillary or supraclavicular lymphadenopathy (exam performed in the presence of a chaperone)   LABORATORY DATA:  I have reviewed the data as listed Lab Results  Component Value Date   WBC 9.1 04/03/2012   HGB 10.8* 04/03/2012   HCT 32.4* 04/03/2012   MCV 91.5 04/03/2012   PLT 161 04/03/2012   Lab Results  Component Value Date   NA 140 04/03/2012   K 3.9 04/03/2012   CL 107 04/03/2012   CO2 24 04/03/2012    RADIOGRAPHIC STUDIES: I have personally reviewed the radiological reports and agreed with the findings in the report.  ASSESSMENT AND PLAN:  Breast cancer, right breast Right breast 7:00: Invasive lobular cancer ER 99%, PR 30%, Ki-67 5% HER-2 negative ratio 1.32, with LCIS; 6:00: Invasive ductal carcinoma ER 100%, PR 86%, Ki-67 15%,  HER-2 negative ratio 1.13 Biopsy right breast beneath the areola: LCIS  Breast MRI 05/26/2014: Right breast enhancing mass 1.4 x 1.5 x 1 cm at 7:00; the 6:00 mass is obscured by clip artifact, retroareolar right breast 3.3 x 3.4 x 4.1 cm area of small focal enhancement 2.3 x 2.4 cm mass right anterior pericardial space probably pericardial cyst.  Pathology and radiology review:Discussed with the patient, the details of pathology including the type of breast cancer,the clinical staging, the significance of ER, PR and HER-2/neu receptors and the implications for treatment. After reviewing the pathology in detail, we proceeded to discuss the different treatment options between surgery, radiation, and antiestrogen therapies.  Recommendation: 1. Agree with the current treatment plan of mastectomy with or without reconstruction 2. Adjuvant antiestrogen therapy with anastrozole 1 mg daily for 5 years  Anastrozole counseling:We discussed the risks and benefits of anti-estrogen therapy with aromatase inhibitors. These include but not limited to insomnia, hot flashes, mood changes, vaginal dryness, bone density loss, and weight gain. Although rare, serious  side effects including endometrial cancer, risk of blood clots were also discussed. We strongly believe that the benefits far outweigh the risks. Patient understands these risks and consented to starting treatment. Planned treatment duration is 5 years.   Return to clinic after surgery to discuss final pathology. All questions were answered. The patient knows to call the clinic with any problems, questions or concerns.    Rulon Eisenmenger, MD 1:24 PM

## 2014-07-16 ENCOUNTER — Encounter: Payer: Self-pay | Admitting: *Deleted

## 2014-07-16 NOTE — Progress Notes (Signed)
Received office notes from [facility], sent to scan.

## 2014-07-17 ENCOUNTER — Other Ambulatory Visit: Payer: Self-pay | Admitting: Hematology and Oncology

## 2014-07-18 ENCOUNTER — Encounter: Payer: Self-pay | Admitting: Genetic Counselor

## 2014-07-18 ENCOUNTER — Telehealth: Payer: Self-pay | Admitting: *Deleted

## 2014-07-18 DIAGNOSIS — C50911 Malignant neoplasm of unspecified site of right female breast: Secondary | ICD-10-CM

## 2014-07-18 DIAGNOSIS — Z800 Family history of malignant neoplasm of digestive organs: Secondary | ICD-10-CM

## 2014-07-18 DIAGNOSIS — Z8041 Family history of malignant neoplasm of ovary: Secondary | ICD-10-CM

## 2014-07-18 DIAGNOSIS — Z803 Family history of malignant neoplasm of breast: Secondary | ICD-10-CM

## 2014-07-18 NOTE — Telephone Encounter (Signed)
Spoke to pt concerning Care Plan summary. Denies questions or concerns regarding dx or treatment. Discussed lack of need for xrt with Mastectomy as it appears by imaging at this time. Informed pt we will have definite decision based on final size of tumor. Received verbal understanding. Encourage pt to call with further questions.

## 2014-07-18 NOTE — Progress Notes (Signed)
[facility] Genetic Test Results   REFERRING PROVIDER: Lurline Del, MD  PRIMARY PROVIDER:  Tivis Ringer, MD  GENETIC TEST RESULT:  Testing Laboratory: [facility]  Test Ordered: OvaNext gene panel Date of Report: 07/15/2014 Result: Normal, no pathogenic mutations identified General Interpretation: Reassuring  HPI: Ms. Everson was previously seen in the [facility] due to concerns regarding a hereditary predisposition to cancer. Please refer to our prior cancer genetics clinic note for more information regarding Ms. Suares's medical, social and family histories, and our assessment and recommendations, at the time. Ms. Soave genetic test results and recommendations warranted by these results were recently disclosed to her and are discussed in more detail below.  GENETIC TEST RESULTS: At the time of Ms. Wagar's visit, we recommended she pursue genetic testing, which includes sequencing and deletion/duplication analysis of several genes associated with an increased risk for cancer via a gene panel. The OvaNext gene panel offered by [employer] includes sequencing and rearrangement analysis for the following 24 genes:ATM, BARD1, BRCA1, BRCA2, BRIP1, CDH1, CHEK2, EPCAM, MLH1, MRE11A, MSH2, MSH6, MUTYH, NBN, NF1, PALB2, PMS2, PTEN, RAD50, RAD51C, RAD51D, SMARCA4, STK11, and TP53. Genetic testing for this gene panel was normal and did not reveal a pathogenic mutation in any of these genes. A copy of the genetic test report will be scanned into Epic under the media tab.   We discussed with Ms. Bougher that current genetic testing is not perfect, and it is, therefore, possible there may be a pathogenic gene mutation in one of these genes that current testing cannot detect, but that chance is small.  We also discussed, that it is possible that another gene that has not yet been discovered, or that we have not yet tested, is  responsible for the cancer diagnoses in her family. It is, therefore, important for Ms. Caggiano to continue to remain in touch with cancer genetics so that we can continue to offer Ms. Sami the most up to date genetic testing.   CANCER SCREENING RECOMMENDATIONS:  This result is reassuring and indicates that Ms. Pennock likely does not have an increased risk for a future cancer due to a mutation in one of these genes. This normal test also suggests that Ms. Tadesse's cancer was most likely not due to an inherited predisposition associated with one of these genes.  Most cancers happen by chance and this negative test suggests that her cancer falls into this category.  We, therefore, recommended she continue to follow the cancer management and screening guidelines provided by her oncology and primary healthcare providers.   RECOMMENDATIONS FOR FAMILY MEMBERS:  We recommended further genetic testing in Ms. Squillace's family as such testing might help [place] be even more confident in Ms. Lichtenberg's own negative results. Specifically, we recommended her two nieces who have had cancer have genetic counseling and testing. Please let us know if we can help facilitate testing and/or a referral. Cancer genetic counselors can also be located, by visiting the website of the [employer] ([email or web address]) and searching for a cancer Dietitian by zip code.  FOLLOW-UP: Lastly, we discussed with Ms. Kennard that cancer genetics is a rapidly advancing field and it is likely that new genetic tests will be appropriate for her and/or family members in the future. We encouraged her to remain in contact with cancer genetics on an annual basis so we can update her personal and family histories and let her know of  advances in cancer genetics that may benefit this family.   Our contact number was provided. Ms. Escalera questions were answered to her satisfaction, and she knows she is welcome to call us at anytime  with additional questions or concerns.    Catherine A. Fine, MS, CGC Certified Psychologist, [email or web address] Phone: [phone]

## 2014-07-23 ENCOUNTER — Other Ambulatory Visit (INDEPENDENT_AMBULATORY_CARE_PROVIDER_SITE_OTHER): Payer: Self-pay

## 2014-07-24 DIAGNOSIS — C50911 Malignant neoplasm of unspecified site of right female breast: Secondary | ICD-10-CM | POA: Diagnosis not present

## 2014-07-25 ENCOUNTER — Other Ambulatory Visit: Payer: Self-pay | Admitting: General Surgery

## 2014-07-25 DIAGNOSIS — C50911 Malignant neoplasm of unspecified site of right female breast: Secondary | ICD-10-CM

## 2014-07-30 ENCOUNTER — Emergency Department (HOSPITAL_COMMUNITY): Payer: Commercial Managed Care - HMO

## 2014-07-30 ENCOUNTER — Encounter (HOSPITAL_COMMUNITY): Payer: Self-pay | Admitting: Emergency Medicine

## 2014-07-30 ENCOUNTER — Observation Stay (HOSPITAL_COMMUNITY)
Admission: EM | Admit: 2014-07-30 | Discharge: 2014-07-31 | Disposition: A | Discharge status: Home / Self Care | Payer: Commercial Managed Care - HMO | Attending: Internal Medicine | Admitting: Internal Medicine

## 2014-07-30 DIAGNOSIS — N390 Urinary tract infection, site not specified: Secondary | ICD-10-CM

## 2014-07-30 DIAGNOSIS — E039 Hypothyroidism, unspecified: Secondary | ICD-10-CM | POA: Diagnosis not present

## 2014-07-30 DIAGNOSIS — I10 Essential (primary) hypertension: Secondary | ICD-10-CM | POA: Diagnosis present

## 2014-07-30 DIAGNOSIS — Z853 Personal history of malignant neoplasm of breast: Secondary | ICD-10-CM | POA: Diagnosis not present

## 2014-07-30 DIAGNOSIS — R200 Anesthesia of skin: Secondary | ICD-10-CM | POA: Insufficient documentation

## 2014-07-30 DIAGNOSIS — F419 Anxiety disorder, unspecified: Secondary | ICD-10-CM | POA: Diagnosis not present

## 2014-07-30 DIAGNOSIS — Z882 Allergy status to sulfonamides status: Secondary | ICD-10-CM | POA: Insufficient documentation

## 2014-07-30 DIAGNOSIS — C50911 Malignant neoplasm of unspecified site of right female breast: Secondary | ICD-10-CM | POA: Diagnosis not present

## 2014-07-30 DIAGNOSIS — I6523 Occlusion and stenosis of bilateral carotid arteries: Secondary | ICD-10-CM | POA: Diagnosis not present

## 2014-07-30 DIAGNOSIS — Z79899 Other long term (current) drug therapy: Secondary | ICD-10-CM | POA: Insufficient documentation

## 2014-07-30 DIAGNOSIS — R42 Dizziness and giddiness: Secondary | ICD-10-CM | POA: Diagnosis not present

## 2014-07-30 DIAGNOSIS — R112 Nausea with vomiting, unspecified: Secondary | ICD-10-CM | POA: Insufficient documentation

## 2014-07-30 DIAGNOSIS — C50511 Malignant neoplasm of lower-outer quadrant of right female breast: Secondary | ICD-10-CM | POA: Diagnosis present

## 2014-07-30 DIAGNOSIS — R6889 Other general symptoms and signs: Secondary | ICD-10-CM | POA: Diagnosis not present

## 2014-07-30 DIAGNOSIS — I6502 Occlusion and stenosis of left vertebral artery: Secondary | ICD-10-CM | POA: Diagnosis not present

## 2014-07-30 DIAGNOSIS — Z883 Allergy status to other anti-infective agents status: Secondary | ICD-10-CM | POA: Diagnosis not present

## 2014-07-30 DIAGNOSIS — R1110 Vomiting, unspecified: Secondary | ICD-10-CM | POA: Diagnosis not present

## 2014-07-30 HISTORY — DX: Malignant (primary) neoplasm, unspecified: C80.1

## 2014-07-30 HISTORY — DX: Unspecified osteoarthritis, unspecified site: M19.90

## 2014-07-30 HISTORY — DX: Personal history of other diseases of the digestive system: Z87.19

## 2014-07-30 HISTORY — DX: Dizziness and giddiness: R42

## 2014-07-30 LAB — DIFFERENTIAL
BASOS ABS: 0 10*3/uL (ref 0.0–0.1)
Basophils Relative: 0 % (ref 0–1)
Eosinophils Absolute: 0.1 10*3/uL (ref 0.0–0.7)
Eosinophils Relative: 1 % (ref 0–5)
LYMPHS PCT: 22 % (ref 12–46)
Lymphs Abs: 1.6 10*3/uL (ref 0.7–4.0)
MONOS PCT: 7 % (ref 3–12)
Monocytes Absolute: 0.6 10*3/uL (ref 0.1–1.0)
NEUTROS PCT: 70 % (ref 43–77)
Neutro Abs: 5.2 10*3/uL (ref 1.7–7.7)

## 2014-07-30 LAB — I-STAT CHEM 8, ED
BUN: 19 mg/dL (ref 6–20)
CALCIUM ION: 1.14 mmol/L (ref 1.13–1.30)
CHLORIDE: 104 mmol/L (ref 101–111)
Creatinine, Ser: 0.8 mg/dL (ref 0.44–1.00)
Glucose, Bld: 106 mg/dL — ABNORMAL HIGH (ref 65–99)
HCT: 41 % (ref 36.0–46.0)
HEMOGLOBIN: 13.9 g/dL (ref 12.0–15.0)
Potassium: 4.3 mmol/L (ref 3.5–5.1)
SODIUM: 139 mmol/L (ref 135–145)
TCO2: 21 mmol/L (ref 0–100)

## 2014-07-30 LAB — URINALYSIS, ROUTINE W REFLEX MICROSCOPIC
Bilirubin Urine: NEGATIVE
GLUCOSE, UA: NEGATIVE mg/dL
Hgb urine dipstick: NEGATIVE
KETONES UR: 15 mg/dL — AB
Nitrite: NEGATIVE
PH: 8 (ref 5.0–8.0)
PROTEIN: NEGATIVE mg/dL
SPECIFIC GRAVITY, URINE: 1.018 (ref 1.005–1.030)
UROBILINOGEN UA: 0.2 mg/dL (ref 0.0–1.0)

## 2014-07-30 LAB — COMPREHENSIVE METABOLIC PANEL
ALBUMIN: 3.9 g/dL (ref 3.5–5.0)
ALK PHOS: 60 U/L (ref 38–126)
ALT: 20 U/L (ref 14–54)
AST: 24 U/L (ref 15–41)
Anion gap: 7 (ref 5–15)
BUN: 16 mg/dL (ref 6–20)
CHLORIDE: 106 mmol/L (ref 101–111)
CO2: 26 mmol/L (ref 22–32)
Calcium: 9.1 mg/dL (ref 8.9–10.3)
Creatinine, Ser: 0.8 mg/dL (ref 0.44–1.00)
GFR calc non Af Amer: >60 mL/min (ref >60)
GLUCOSE: 110 mg/dL — AB (ref 65–99)
Potassium: 4.4 mmol/L (ref 3.5–5.1)
SODIUM: 139 mmol/L (ref 135–145)
Total Bilirubin: 0.4 mg/dL (ref 0.3–1.2)
Total Protein: 6.4 g/dL — ABNORMAL LOW (ref 6.5–8.1)

## 2014-07-30 LAB — CBC
HEMATOCRIT: 39.3 % (ref 36.0–46.0)
HEMOGLOBIN: 13.2 g/dL (ref 12.0–15.0)
MCH: 30.2 pg (ref 26.0–34.0)
MCHC: 33.6 g/dL (ref 30.0–36.0)
MCV: 89.9 fL (ref 78.0–100.0)
Platelets: 209 10*3/uL (ref 150–400)
RBC: 4.37 MIL/uL (ref 3.87–5.11)
RDW: 12.4 % (ref 11.5–15.5)
WBC: 7.5 10*3/uL (ref 4.0–10.5)

## 2014-07-30 LAB — RAPID URINE DRUG SCREEN, HOSP PERFORMED
Amphetamines: NOT DETECTED
BARBITURATES: NOT DETECTED
Benzodiazepines: POSITIVE — AB
COCAINE: NOT DETECTED
Opiates: NOT DETECTED
TETRAHYDROCANNABINOL: NOT DETECTED

## 2014-07-30 LAB — URINE MICROSCOPIC-ADD ON

## 2014-07-30 LAB — ETHANOL: Alcohol, Ethyl (B): <5 mg/dL (ref <5)

## 2014-07-30 LAB — PROTIME-INR
INR: 0.99 (ref 0.00–1.49)
PROTHROMBIN TIME: 13.3 s (ref 11.6–15.2)

## 2014-07-30 LAB — I-STAT TROPONIN, ED: Troponin i, poc: 0 ng/mL (ref 0.00–0.08)

## 2014-07-30 LAB — APTT: aPTT: 32 seconds (ref 24–37)

## 2014-07-30 MED ORDER — CEFTRIAXONE SODIUM IN DEXTROSE 20 MG/ML IV SOLN
1.0000 g | INTRAVENOUS | Status: DC
  Administered 2014-07-31: 1 g via INTRAVENOUS
  Filled 2014-07-30: qty 50

## 2014-07-30 MED ORDER — METOPROLOL SUCCINATE ER 100 MG PO TB24
100.0000 mg | ORAL_TABLET | Freq: Every day | ORAL | Status: DC
  Filled 2014-07-30: qty 1

## 2014-07-30 MED ORDER — SODIUM CHLORIDE 0.9 % IV SOLN
INTRAVENOUS | Status: AC
  Administered 2014-07-31: 01:00:00 via INTRAVENOUS

## 2014-07-30 MED ORDER — ALBUTEROL SULFATE (2.5 MG/3ML) 0.083% IN NEBU: 2.5000 mg | INHALATION_SOLUTION | RESPIRATORY_TRACT | Status: DC | PRN

## 2014-07-30 MED ORDER — ACETAMINOPHEN 650 MG RE SUPP: 650.0000 mg | Freq: Four times a day (QID) | RECTAL | Status: DC | PRN

## 2014-07-30 MED ORDER — ALPRAZOLAM 0.5 MG PO TABS
0.5000 mg | ORAL_TABLET | Freq: Three times a day (TID) | ORAL | Status: DC | PRN
  Administered 2014-07-31: 0.5 mg via ORAL
  Filled 2014-07-30: qty 1

## 2014-07-30 MED ORDER — SODIUM CHLORIDE 0.9 % IJ SOLN: 3.0000 mL | Freq: Two times a day (BID) | INTRAMUSCULAR | Status: DC

## 2014-07-30 MED ORDER — IOHEXOL 350 MG/ML SOLN
50.0000 mL | Freq: Once | INTRAVENOUS | Status: AC | PRN
  Administered 2014-07-30: 50 mL via INTRAVENOUS

## 2014-07-30 MED ORDER — METHYLPREDNISOLONE SODIUM SUCC 125 MG IJ SOLR
125.0000 mg | Freq: Once | INTRAMUSCULAR | Status: AC
  Administered 2014-07-30: 125 mg via INTRAVENOUS
  Filled 2014-07-30: qty 2

## 2014-07-30 MED ORDER — LEVOTHYROXINE SODIUM 50 MCG PO TABS
50.0000 ug | ORAL_TABLET | Freq: Every day | ORAL | Status: DC
  Administered 2014-07-31: 50 ug via ORAL
  Filled 2014-07-30 (×2): qty 1

## 2014-07-30 MED ORDER — ONDANSETRON HCL 4 MG PO TABS: 4.0000 mg | ORAL_TABLET | Freq: Four times a day (QID) | ORAL | Status: DC | PRN

## 2014-07-30 MED ORDER — ACETAMINOPHEN 325 MG PO TABS
650.0000 mg | ORAL_TABLET | Freq: Four times a day (QID) | ORAL | Status: DC | PRN
Start: 2014-07-30 — End: 2014-07-31

## 2014-07-30 MED ORDER — MECLIZINE HCL 12.5 MG PO TABS
12.5000 mg | ORAL_TABLET | Freq: Three times a day (TID) | ORAL | Status: DC
  Administered 2014-07-31 (×2): 12.5 mg via ORAL
  Filled 2014-07-30 (×4): qty 1

## 2014-07-30 MED ORDER — SODIUM CHLORIDE 0.9 % IV SOLN
INTRAVENOUS | Status: DC
  Administered 2014-07-30: 15:00:00 via INTRAVENOUS

## 2014-07-30 MED ORDER — PROMETHAZINE HCL 25 MG/ML IJ SOLN
6.2500 mg | Freq: Once | INTRAMUSCULAR | Status: AC
  Administered 2014-07-30: 6.25 mg via INTRAVENOUS
  Filled 2014-07-30 (×2): qty 1

## 2014-07-30 MED ORDER — PANTOPRAZOLE SODIUM 40 MG PO TBEC
40.0000 mg | DELAYED_RELEASE_TABLET | Freq: Every day | ORAL | Status: DC
  Administered 2014-07-31: 40 mg via ORAL
  Filled 2014-07-30: qty 1

## 2014-07-30 MED ORDER — ENOXAPARIN SODIUM 40 MG/0.4ML ~~LOC~~ SOLN
40.0000 mg | SUBCUTANEOUS | Status: DC
  Administered 2014-07-31: 40 mg via SUBCUTANEOUS
  Filled 2014-07-30: qty 0.4

## 2014-07-30 MED ORDER — IRBESARTAN 150 MG PO TABS
150.0000 mg | ORAL_TABLET | Freq: Every day | ORAL | Status: DC
  Administered 2014-07-31: 150 mg via ORAL
  Filled 2014-07-30: qty 1

## 2014-07-30 MED ORDER — DIPHENHYDRAMINE HCL 50 MG/ML IJ SOLN
50.0000 mg | Freq: Once | INTRAMUSCULAR | Status: AC
  Administered 2014-07-30: 50 mg via INTRAVENOUS
  Filled 2014-07-30: qty 1

## 2014-07-30 MED ORDER — ONDANSETRON HCL 4 MG/2ML IJ SOLN: 4.0000 mg | Freq: Four times a day (QID) | INTRAMUSCULAR | Status: DC | PRN

## 2014-07-30 NOTE — H&P (Signed)
[facility] History and Physical  [name] XKG:[id number] DOB: December 01, 1934 DOA: 07/30/2014   PCP: Tivis Ringer, MD  Specialists: Followed by the cancer [facility] for breast cancer  Chief Complaint: Dizziness  HPI: [name] is a 80 y.o. female with a past medical history of vertigo, right breast cancer, hypothyroidism, hypertension, who was in her usual state of health until this morning when she woke up and felt unsteady. She had to walk holding onto furniture. Her last episode of vertigo was about 3 years ago. Over the last few months she's had extensive testing for breast cancer and the plan is to undergo right mastectomy on June 14. She has been very anxious as a result. This morning there was no spinning sensation, but later on in the day she has felt as if the room was spinning around her. She had multiple episodes of nausea and vomiting. She also experienced some numbness in the upper lip and nose, which has resolved. Denies any nausea, vomiting. No weakness on any one side of the body. She did have some pain in her right arm but denies any weakness. That pain has resolved. Denies any difficulty with speech. No chest pain or shortness of breath. Still feels unsteady. Emergency department providers have discussed with neurology who recommends MRI and observation.  Home Medications: Prior to Admission medications   Medication Sig Start Date End Date Taking? Authorizing Provider  acetaminophen (TYLENOL) 500 MG tablet Take 500 mg by mouth every 6 (six) hours as needed for mild pain or headache.   Yes Historical Provider, MD  ALPRAZolam Duanne Moron) 0.5 MG tablet Take 0.5 mg by mouth 3 (three) times daily as needed for sleep or anxiety.   Yes Historical Provider, MD  BENICAR 40 MG tablet Take 20 mg by mouth daily.  06/25/14  Yes Historical Provider, MD  levothyroxine (SYNTHROID, LEVOTHROID) 50 MCG tablet Take 50 mcg by mouth every morning.   Yes Historical Provider, MD  metoprolol  succinate (TOPROL-XL) 100 MG 24 hr tablet Take 100 mg by mouth daily. Take with or immediately following a meal.   Yes Historical Provider, MD  Multiple Vitamin (MULTIVITAMIN WITH MINERALS) TABS Take 1 tablet by mouth daily.   Yes Historical Provider, MD  omeprazole (PRILOSEC) 20 MG capsule Take 20 mg by mouth daily.   Yes Historical Provider, MD    Allergies:  Allergies  Allergen Reactions  . Iodine Rash  . Sulfa Antibiotics Rash    Past Medical History: Past Medical History  Diagnosis Date  . Hypertension   . Thyroid disease   . C. difficile colitis   . Vertigo   . Cancer     Past Surgical History  Procedure Laterality Date  . Abdominal hysterectomy    . Tubal ligation    . Cholecystectomy    . Tonsillectomy    . Eye surgery      Social History: She lives in [place] with her husband. No history of smoking, alcohol use or illicit drug use. Usually independent with ambulation.  Family History:  Family History  Problem Relation Age of Onset  . Cancer Sister 52    breast  . Cancer Brother 92    colorectal cancer  . Cancer Sister 35    atyipical breast hyperplasia -  mastectomy  . Cancer Other 46    niece (daughter of sister with atypical breast cells) with breast cancer   . Cancer Other 87    nephew (son of brother with colorectal ca) with  coloreactal cancer  . Cancer Other     great niece (brother's daughter's daughter) with ovarian cancer     Review of Systems - History obtained from the patient General ROS: positive for  - fatigue Psychological ROS: positive for - anxiety Ophthalmic ROS: negative ENT ROS: negative Allergy and Immunology ROS: negative Hematological and Lymphatic ROS: negative Endocrine ROS: negative Respiratory ROS: no cough, shortness of breath, or wheezing Cardiovascular ROS: no chest pain or dyspnea on exertion Gastrointestinal ROS: no abdominal pain, change in bowel habits, or black or bloody stools Genito-Urinary ROS: no dysuria,  trouble voiding, or hematuria Musculoskeletal ROS: negative Neurological ROS: as in hpi Dermatological ROS: negative  Physical Examination  Filed Vitals:   07/30/14 1930 07/30/14 2000 07/30/14 2030 07/30/14 2100  BP: 110/45 109/50 118/65 126/55  Pulse: 74 74 70 71  Temp:      TempSrc:      Resp: _0 Height:      Weight:      SpO2: 93% 94% 98% 96%    BP 126/55 mmHg  Pulse 71  Temp(Src) 97.5 F (36.4 C) (Oral)  Resp 13  Ht 5' (1.524 m)  Wt 83.008 kg (183 lb)  BMI 35.74 kg/m2  SpO2 96%  General appearance: alert, cooperative, appears stated age and no distress Head: Normocephalic, without obvious abnormality, atraumatic Eyes: conjunctivae/corneas clear. PERRL, EOM's intact. Throat: lips, mucosa, and tongue normal; teeth and gums normal Neck: no adenopathy, no carotid bruit, no JVD, supple, symmetrical, trachea midline and thyroid not enlarged, symmetric, no tenderness/mass/nodules Resp: clear to auscultation bilaterally Cardio: regular rate and rhythm, S1, S2 normal, no murmur, click, rub or gallop GI: soft, non-tender; bowel sounds normal; no masses,  no organomegaly Extremities: extremities normal, atraumatic, no cyanosis or edema Pulses: 2+ and symmetric Skin: Skin color, texture, turgor normal. No rashes or lesions Lymph nodes: Cervical, supraclavicular, and axillary nodes normal. Neurologic: Alert and oriented 3. Cranial no stool to 12 intact. No Pronator drift. Motor strength equal bilateral upper and lower extremities.  Laboratory Data: Results for orders placed or performed during the hospital encounter of 07/30/14 (from the past 48 hour(s))  Ethanol     Status: None   Collection Time: 07/30/14  2:50 PM  Result Value Ref Range   Alcohol, Ethyl (B) <5 <5 mg/dL    Comment:        LOWEST DETECTABLE LIMIT FOR SERUM ALCOHOL IS 5 mg/dL FOR MEDICAL PURPOSES ONLY   Protime-INR     Status: None   Collection Time: 07/30/14  2:50 PM  Result Value Ref Range     Prothrombin Time 13.3 11.6 - 15.2 seconds   INR 0.99 0.00 - 1.49  APTT     Status: None   Collection Time: 07/30/14  2:50 PM  Result Value Ref Range   aPTT 32 24 - 37 seconds  CBC     Status: None   Collection Time: 07/30/14  2:50 PM  Result Value Ref Range   WBC 7.5 4.0 - 10.5 K/uL   RBC 4.37 3.87 - 5.11 MIL/uL   Hemoglobin 13.2 12.0 - 15.0 g/dL   HCT 39.3 36.0 - 46.0 %   MCV 89.9 78.0 - 100.0 fL   MCH 30.2 26.0 - 34.0 pg   MCHC 33.6 30.0 - 36.0 g/dL   RDW 12.4 11.5 - 15.5 %   Platelets 209 150 - 400 K/uL  Differential     Status: None   Collection Time: 07/30/14  2:50  PM  Result Value Ref Range   Neutrophils Relative % 70 43 - 77 %   Neutro Abs 5.2 1.7 - 7.7 K/uL   Lymphocytes Relative 22 12 - 46 %   Lymphs Abs 1.6 0.7 - 4.0 K/uL   Monocytes Relative 7 3 - 12 %   Monocytes Absolute 0.6 0.1 - 1.0 K/uL   Eosinophils Relative 1 0 - 5 %   Eosinophils Absolute 0.1 0.0 - 0.7 K/uL   Basophils Relative 0 0 - 1 %   Basophils Absolute 0.0 0.0 - 0.1 K/uL  Comprehensive metabolic panel     Status: Abnormal   Collection Time: 07/30/14  2:50 PM  Result Value Ref Range   Sodium 139 135 - 145 mmol/L   Potassium 4.4 3.5 - 5.1 mmol/L   Chloride 106 101 - 111 mmol/L   CO2 26 22 - 32 mmol/L   Glucose, Bld 110 (H) 65 - 99 mg/dL   BUN 16 6 - 20 mg/dL   Creatinine, Ser 0.80 0.44 - 1.00 mg/dL   Calcium 9.1 8.9 - 10.3 mg/dL   Total Protein 6.4 (L) 6.5 - 8.1 g/dL   Albumin 3.9 3.5 - 5.0 g/dL   AST 24 15 - 41 U/L   ALT 20 14 - 54 U/L   Alkaline Phosphatase 60 38 - 126 U/L   Total Bilirubin 0.4 0.3 - 1.2 mg/dL   GFR calc non Af Amer >60 >60 mL/min   GFR calc Af Amer >60 >60 mL/min    Comment: (NOTE) The eGFR has been calculated using the CKD EPI equation. This calculation has not been validated in all clinical situations. eGFR's persistently <60 mL/min signify possible Chronic Kidney Disease.    Anion gap 7 5 - 15  I-stat troponin, ED (not at [facility], [facility])     Status: None    Collection Time: 07/30/14  2:57 PM  Result Value Ref Range   Troponin i, poc 0.00 0.00 - 0.08 ng/mL   Comment 3            Comment: Due to the release kinetics of cTnI, a negative result within the first hours of the onset of symptoms does not rule out myocardial infarction with certainty. If myocardial infarction is still suspected, repeat the test at appropriate intervals.   I-Stat Chem 8, ED  (not at [facility], [facility])     Status: Abnormal   Collection Time: 07/30/14  2:59 PM  Result Value Ref Range   Sodium 139 135 - 145 mmol/L   Potassium 4.3 3.5 - 5.1 mmol/L   Chloride 104 101 - 111 mmol/L   BUN 19 6 - 20 mg/dL   Creatinine, Ser 0.80 0.44 - 1.00 mg/dL   Glucose, Bld 106 (H) 65 - 99 mg/dL   Calcium, Ion 1.14 1.13 - 1.30 mmol/L   TCO2 21 0 - 100 mmol/L   Hemoglobin 13.9 12.0 - 15.0 g/dL   HCT 41.0 36.0 - 46.0 %  Urine rapid drug screen (hosp performed)not at [facility]     Status: Abnormal   Collection Time: 07/30/14  4:06 PM  Result Value Ref Range   Opiates NONE DETECTED NONE DETECTED   Cocaine NONE DETECTED NONE DETECTED   Benzodiazepines POSITIVE (A) NONE DETECTED   Amphetamines NONE DETECTED NONE DETECTED   Tetrahydrocannabinol NONE DETECTED NONE DETECTED   Barbiturates NONE DETECTED NONE DETECTED    Comment:        DRUG SCREEN FOR MEDICAL PURPOSES ONLY.  IF CONFIRMATION IS NEEDED  FOR ANY PURPOSE, NOTIFY LAB WITHIN 5 DAYS.        LOWEST DETECTABLE LIMITS FOR URINE DRUG SCREEN Drug Class       Cutoff (ng/mL) Amphetamine      1000 Barbiturate      200 Benzodiazepine   161 Tricyclics       096 Opiates          300 Cocaine          300 THC              50   Urinalysis, Routine w reflex microscopic (not at [facility])     Status: Abnormal   Collection Time: 07/30/14  4:06 PM  Result Value Ref Range   Color, Urine YELLOW YELLOW   APPearance CLOUDY (A) CLEAR   Specific Gravity, Urine 1.018 1.005 - 1.030   pH 8.0 5.0 - 8.0   Glucose, UA NEGATIVE NEGATIVE mg/dL   Hgb urine  dipstick NEGATIVE NEGATIVE   Bilirubin Urine NEGATIVE NEGATIVE   Ketones, ur 15 (A) NEGATIVE mg/dL   Protein, ur NEGATIVE NEGATIVE mg/dL   Urobilinogen, UA 0.2 0.0 - 1.0 mg/dL   Nitrite NEGATIVE NEGATIVE   Leukocytes, UA SMALL (A) NEGATIVE  Urine microscopic-add on     Status: None   Collection Time: 07/30/14  4:06 PM  Result Value Ref Range   Squamous Epithelial / LPF RARE RARE   WBC, UA 11-20 <3 WBC/hpf   Bacteria, UA RARE RARE    Radiology Reports: Ct Angio Head W/cm &/or Wo Cm  07/30/2014   CLINICAL DATA:  Acute onset of dizziness. Vertigo. Numbness in her lips.  EXAM: CT ANGIOGRAPHY HEAD AND NECK  TECHNIQUE: Multidetector CT imaging of the head and neck was performed using the standard protocol during bolus administration of intravenous contrast. Multiplanar CT image reconstructions and MIPs were obtained to evaluate the vascular anatomy. Carotid stenosis measurements (when applicable) are obtained utilizing NASCET criteria, using the distal internal carotid diameter as the denominator.  CONTRAST:  60m OMNIPAQUE IOHEXOL 350 MG/ML SOLN  COMPARISON:  MRI brain 05/17/2005.  FINDINGS: CT HEAD  Brain: Mild atrophy and white matter changes are again noted. Basal ganglia are intact. Insular ribbon is normal. No acute cortical infarct is present. There is no acute hemorrhage or mass lesion.  Calvarium and skull base: Within normal limits  Paranasal sinuses: Cleared  Orbits: Bilateral lens replacements are noted. Globes and orbits are otherwise intact.  CTA NECK  Aortic arch: A 3 vessel arch configuration is present. No focal stenosis is evident at the great vessel origins.  Right carotid system: Right common carotid artery is normal. Bifurcation is unremarkable. Moderate tortuosity is present in the cervical right ICA. There is no significant stenosis relative to the more distal vessel.  Left carotid system: The left common carotid artery is within normal limits. The bifurcation is unremarkable.  Moderate tortuosity is present in the cervical left ICA without significant stenosis. Mild vessel irregularity suggests possibility of FMD.  Vertebral arteries:The vertebral arteries both originate from the subclavian arteries. There is mild narrowing of the approximately 50% in the left vertebral artery tortuosity is worse on the left is well. Dental artifact partially obscures the vertebral arteries at the C2-3 level. There are no focal vertebral artery lesions in the neck.  Skeleton: There straightening and some reversal of the normal cervical lordosis. Multilevel endplate degenerative change in uncovertebral spurring is noted, most prominent from C4-5 through C6-7. The facets are aligned. No focal lytic or  blastic lesions are evident.  Other neck: The dominant left thyroid mass enhances heterogeneously. The lesion measures 3.0 x 2.5 x 3.1 cm. No focal mucosal or submucosal lesions are present. There is no significant adenopathy. The salivary glands are within normal limits.  CTA HEAD  Anterior circulation: Atherosclerotic calcifications are present within the cavernous internal carotid arteries bilaterally. There is no focal stenosis. An infundibulum is noted at the left posterior communicating artery. The A1 and M1 segments are normal. The anterior communicating artery is patent. The MCA bifurcations are intact. Mild narrowing of distal MCA and ACA branch vessels is present without a significant proximal stenosis, aneurysm, or branch vessel occlusion.  Posterior circulation: The vertebral arteries are codominant. PICA origins are visualized and normal. The vertebrobasilar junction is within normal limits. Prominent AICA vessels are noted bilaterally. Both posterior cerebral arteries originate from the basilar tip. There is some attenuation of distal PCA branch vessels bilaterally.  Venous sinuses: The dural sinuses are patent. The right transverse sinus is dominant. The straight sinus and deep cerebral veins  are patent.  Anatomic variants: None  Delayed phase: The postcontrast images the brain demonstrate no pathologic enhancement.  IMPRESSION: 1. No acute intracranial abnormality. 2. Stable atrophy and white matter disease. 3. Distal small vessel disease is present bilaterally in both the anterior and posterior circulation. 4. Moderate tortuosity of the cervical internal carotid arteries bilaterally without significant stenosis. 5. Subtle irregularity of the cervical internal carotid arteries bilaterally suggests the possibility of FMD. There is no associated dissection. 6. Tortuosity of the proximal left vertebral artery without significant stenosis. 7. Atherosclerotic changes within the cavernous internal carotid arteries without focal stenosis.   Electronically Signed   By: San Morelle M.D.   On: 07/30/2014 18:17   Dg Chest 2 View  07/30/2014   CLINICAL DATA:  Dizziness and anxiety.  Emesis.  Acute onset.  EXAM: CHEST  2 VIEW  COMPARISON:  05/16/2005  FINDINGS: Normal mediastinum and cardiac silhouette. Normal pulmonary vasculature. No evidence of effusion, infiltrate, or pneumothorax. No acute bony abnormality.  IMPRESSION: No acute cardiopulmonary process.   Electronically Signed   By: Suzy Bouchard M.D.   On: 07/30/2014 15:21   Ct Angio Neck W/cm &/or Wo/cm  07/30/2014   CLINICAL DATA:  Acute onset of dizziness. Vertigo. Numbness in her lips.  EXAM: CT ANGIOGRAPHY HEAD AND NECK  TECHNIQUE: Multidetector CT imaging of the head and neck was performed using the standard protocol during bolus administration of intravenous contrast. Multiplanar CT image reconstructions and MIPs were obtained to evaluate the vascular anatomy. Carotid stenosis measurements (when applicable) are obtained utilizing NASCET criteria, using the distal internal carotid diameter as the denominator.  CONTRAST:  69m OMNIPAQUE IOHEXOL 350 MG/ML SOLN  COMPARISON:  MRI brain 05/17/2005.  FINDINGS: CT HEAD  Brain: Mild atrophy and  white matter changes are again noted. Basal ganglia are intact. Insular ribbon is normal. No acute cortical infarct is present. There is no acute hemorrhage or mass lesion.  Calvarium and skull base: Within normal limits  Paranasal sinuses: Cleared  Orbits: Bilateral lens replacements are noted. Globes and orbits are otherwise intact.  CTA NECK  Aortic arch: A 3 vessel arch configuration is present. No focal stenosis is evident at the great vessel origins.  Right carotid system: Right common carotid artery is normal. Bifurcation is unremarkable. Moderate tortuosity is present in the cervical right ICA. There is no significant stenosis relative to the more distal vessel.  Left carotid system: The left  common carotid artery is within normal limits. The bifurcation is unremarkable. Moderate tortuosity is present in the cervical left ICA without significant stenosis. Mild vessel irregularity suggests possibility of FMD.  Vertebral arteries:The vertebral arteries both originate from the subclavian arteries. There is mild narrowing of the approximately 50% in the left vertebral artery tortuosity is worse on the left is well. Dental artifact partially obscures the vertebral arteries at the C2-3 level. There are no focal vertebral artery lesions in the neck.  Skeleton: There straightening and some reversal of the normal cervical lordosis. Multilevel endplate degenerative change in uncovertebral spurring is noted, most prominent from C4-5 through C6-7. The facets are aligned. No focal lytic or blastic lesions are evident.  Other neck: The dominant left thyroid mass enhances heterogeneously. The lesion measures 3.0 x 2.5 x 3.1 cm. No focal mucosal or submucosal lesions are present. There is no significant adenopathy. The salivary glands are within normal limits.  CTA HEAD  Anterior circulation: Atherosclerotic calcifications are present within the cavernous internal carotid arteries bilaterally. There is no focal stenosis. An  infundibulum is noted at the left posterior communicating artery. The A1 and M1 segments are normal. The anterior communicating artery is patent. The MCA bifurcations are intact. Mild narrowing of distal MCA and ACA branch vessels is present without a significant proximal stenosis, aneurysm, or branch vessel occlusion.  Posterior circulation: The vertebral arteries are codominant. PICA origins are visualized and normal. The vertebrobasilar junction is within normal limits. Prominent AICA vessels are noted bilaterally. Both posterior cerebral arteries originate from the basilar tip. There is some attenuation of distal PCA branch vessels bilaterally.  Venous sinuses: The dural sinuses are patent. The right transverse sinus is dominant. The straight sinus and deep cerebral veins are patent.  Anatomic variants: None  Delayed phase: The postcontrast images the brain demonstrate no pathologic enhancement.  IMPRESSION: 1. No acute intracranial abnormality. 2. Stable atrophy and white matter disease. 3. Distal small vessel disease is present bilaterally in both the anterior and posterior circulation. 4. Moderate tortuosity of the cervical internal carotid arteries bilaterally without significant stenosis. 5. Subtle irregularity of the cervical internal carotid arteries bilaterally suggests the possibility of FMD. There is no associated dissection. 6. Tortuosity of the proximal left vertebral artery without significant stenosis. 7. Atherosclerotic changes within the cavernous internal carotid arteries without focal stenosis.   Electronically Signed   By: San Morelle M.D.   On: 07/30/2014 18:17    Electrocardiogram: Sinus rhythm at 72 bpm. Normal axis. Intervals are normal. Nonspecific ST-T wave changes.   Problem List  Principal Problem:   Vertigo Active Problems:   Breast cancer, right breast   Hypothyroidism   Essential hypertension   Assessment: This is a 80 year old Caucasian female with past  medical history as stated earlier, who presents with symptoms suggestive of vertigo. CT angiogram of the head and neck did not show any acute findings. She does not have any focal neurological signs. But she still quite dizzy. Neurology has recommended MRI of the brain. UA is noted to be abnormal.  Plan: #1 Vertigo: MRI brain will be ordered per neurology recommendations. She'll be placed on meclizine. PT and OT to evaluate. UTI could be present.  #2 History of right breast cancer: She is supposed to undergo mastectomy on June 14. She is followed by the [facility]  #3 history of hypothyroidism: Continue with Synthroid  #4 history of essential hypertension: Continue with her home medications  #5 Possible UTI: Urine cultures. Ceftriaxone.  DVT Prophylaxis: Lovenox Code Status: Full code Family Communication: Discussed with the patient and her sister-in-law  Disposition Plan: Observe to telemetry   Further management decisions will depend on results of further testing and patient's response to treatment.   [facility]  [facility] Pager [phone]  If 7PM-7AM, please contact night-coverage [email or web address] Password [facility]  07/30/2014, 10:51 PM

## 2014-07-30 NOTE — ED Notes (Signed)
8 mg zofran given en route.

## 2014-07-30 NOTE — ED Provider Notes (Signed)
CSN: [id number]     Arrival date & time 07/30/14  1358 History   First MD Initiated Contact with Patient 07/30/14 1407     Chief Complaint  Patient presents with  . Dizziness  . Anxiety  . Emesis      HPI  Pt was seen at 1410. Per EMS and pt report, c/o sudden onset and persistence of constant "vertigo" since approximately 0700 this morning PTA. Pt states when she woke up at 0700 she was "having trouble walking" and "figured it was my vertigo." Pt states she has hx of vertigo "for the past 20 years." Pt states approximately 11am the "room started spinning" and she began to have multiple episodes of N/V. Pt states at noon today she developed left upper lip "numbness." EMS gave pt zofran en route and noted pt told them she was "under a lot of stress" due to an upcoming mastectomy later on this week. Pt denies facial droop, no slurred speech, no visual changes, no headache, no neck pain, no CP/palpitations, no SOB/cough, no abd pain, no diarrhea, no black or blood in emesis, no falls, no syncope/near syncope, no focal motor weakness, no tingling/numbness in extremities.   Past Medical History  Diagnosis Date  . Hypertension   . Thyroid disease   . C. difficile colitis   . Vertigo    Past Surgical History  Procedure Laterality Date  . Abdominal hysterectomy    . Tubal ligation    . Cholecystectomy    . Tonsillectomy    . Eye surgery     Family History  Problem Relation Age of Onset  . Cancer Sister 30    breast  . Cancer Brother 9    colorectal cancer  . Cancer Sister 82    atyipical breast hyperplasia -  mastectomy  . Cancer Other 35    niece (daughter of sister with atypical breast cells) with breast cancer   . Cancer Other 4    nephew (son of brother with colorectal ca) with coloreactal cancer  . Cancer Other     great niece (brother's daughter's daughter) with ovarian cancer   History  Substance Use Topics  . Smoking status: Never Smoker   . Smokeless tobacco: Not on  file  . Alcohol Use: No    Review of Systems ROS: Statement: All systems negative except as marked or noted in the HPI; Constitutional: Negative for fever and chills. ; ; Eyes: Negative for eye pain, redness and discharge. ; ; ENMT: Negative for ear pain, hoarseness, nasal congestion, sinus pressure and sore throat. ; ; Cardiovascular: Negative for chest pain, palpitations, diaphoresis, dyspnea and peripheral edema. ; ; Respiratory: Negative for cough, wheezing and stridor. ; ; Gastrointestinal: +N/V. Negative for diarrhea, abdominal pain, blood in stool, hematemesis, jaundice and rectal bleeding. . ; ; Genitourinary: Negative for dysuria, flank pain and hematuria. ; ; Musculoskeletal: Negative for back pain and neck pain. Negative for swelling and trauma.; ; Skin: Negative for pruritus, rash, abrasions, blisters, bruising and skin lesion.; ; Neuro: +"dizziness." Negative for headache, lightheadedness and neck stiffness. Negative for weakness, altered level of consciousness , altered mental status, extremity weakness, paresthesias, involuntary movement, seizure and syncope.; Psych:  +anxiety. No SI, no SA, no HI, no hallucinations.       Allergies  Iodine and Sulfa antibiotics  Home Medications   Prior to Admission medications   Medication Sig Start Date End Date Taking? Authorizing Provider  acetaminophen (TYLENOL) 500 MG tablet Take 500 mg by  mouth every 6 (six) hours as needed for mild pain or headache.   Yes Historical Provider, MD  ALPRAZolam Duanne Moron) 0.5 MG tablet Take 0.5 mg by mouth 3 (three) times daily as needed for sleep or anxiety.   Yes Historical Provider, MD  BENICAR 40 MG tablet Take 20 mg by mouth daily.  06/25/14  Yes Historical Provider, MD  levothyroxine (SYNTHROID, LEVOTHROID) 50 MCG tablet Take 50 mcg by mouth every morning.   Yes Historical Provider, MD  metoprolol succinate (TOPROL-XL) 100 MG 24 hr tablet Take 100 mg by mouth daily. Take with or immediately following a meal.    Yes Historical Provider, MD  Multiple Vitamin (MULTIVITAMIN WITH MINERALS) TABS Take 1 tablet by mouth daily.   Yes Historical Provider, MD  omeprazole (PRILOSEC) 20 MG capsule Take 20 mg by mouth daily.   Yes Historical Provider, MD   BP 114/96 mmHg  Pulse 74  Temp(Src) 97.5 F (36.4 C) (Oral)  Resp 21  Ht 5' (1.524 m)  Wt 183 lb (83.008 kg)  BMI 35.74 kg/m2  SpO2 100% Physical Exam  1415: Physical examination:  Nursing notes reviewed; Vital signs and O2 SAT reviewed;  Constitutional: Well developed, Well nourished, Well hydrated, Crying. ; Head:  Normocephalic, atraumatic; Eyes: EOMI, PERRL, No scleral icterus; ENMT: Mouth and pharynx normal, Mucous membranes moist; Neck: Supple, Full range of motion, No lymphadenopathy; Cardiovascular: Regular rate and rhythm, No gallop; Respiratory: Breath sounds clear & equal bilaterally, No wheezes.  Speaking full sentences with ease, Normal respiratory effort/excursion. Intermittently hyperventilating during exam.; Chest: Nontender, Movement normal; Abdomen: Soft, Nontender, Nondistended, Normal bowel sounds; Genitourinary: No CVA tenderness; Extremities: Pulses normal, No tenderness, No edema, No calf edema or asymmetry.; Neuro: AA&Ox3, Major CN grossly intact. Speech clear.  No facial droop.  No nystagmus. Grips equal. Strength 5/5 equal bilat UE's and LE's.  DTR 2/4 equal bilat UE's and LE's.  No gross sensory deficits.  Normal cerebellar testing bilat UE's (finger-nose) and LE's (heel-shin)..; Skin: Color normal, Warm, Dry.; Psych:  Anxious.    ED Course  Procedures     EKG Interpretation   Date/Time:  Wednesday July 30 2014 14:08:14 EDT Ventricular Rate:  72 PR Interval:  171 QRS Duration: 81 QT Interval:  411 QTC Calculation: 450 R Axis:   45 Text Interpretation:  Sinus rhythm Artifact When compared with ECG of  11/02/2002 No significant change was found Confirmed by [facility]  MD,  Nunzio Cory [phone]) on 07/30/2014 2:35:53 PM      MDM   MDM Reviewed: previous chart, nursing note and vitals Reviewed previous: labs and ECG Interpretation: ECG, labs and x-ray     Results for orders placed or performed during the hospital encounter of 07/30/14  Ethanol  Result Value Ref Range   Alcohol, Ethyl (B) <5 <5 mg/dL  Protime-INR  Result Value Ref Range   Prothrombin Time 13.3 11.6 - 15.2 seconds   INR 0.99 0.00 - 1.49  APTT  Result Value Ref Range   aPTT 32 24 - 37 seconds  CBC  Result Value Ref Range   WBC 7.5 4.0 - 10.5 K/uL   RBC 4.37 3.87 - 5.11 MIL/uL   Hemoglobin 13.2 12.0 - 15.0 g/dL   HCT 39.3 36.0 - 46.0 %   MCV 89.9 78.0 - 100.0 fL   MCH 30.2 26.0 - 34.0 pg   MCHC 33.6 30.0 - 36.0 g/dL   RDW 12.4 11.5 - 15.5 %   Platelets 209 150 - 400 K/uL  Differential  Result Value Ref Range   Neutrophils Relative % 70 43 - 77 %   Neutro Abs 5.2 1.7 - 7.7 K/uL   Lymphocytes Relative 22 12 - 46 %   Lymphs Abs 1.6 0.7 - 4.0 K/uL   Monocytes Relative 7 3 - 12 %   Monocytes Absolute 0.6 0.1 - 1.0 K/uL   Eosinophils Relative 1 0 - 5 %   Eosinophils Absolute 0.1 0.0 - 0.7 K/uL   Basophils Relative 0 0 - 1 %   Basophils Absolute 0.0 0.0 - 0.1 K/uL  Comprehensive metabolic panel  Result Value Ref Range   Sodium 139 135 - 145 mmol/L   Potassium 4.4 3.5 - 5.1 mmol/L   Chloride 106 101 - 111 mmol/L   CO2 26 22 - 32 mmol/L   Glucose, Bld 110 (H) 65 - 99 mg/dL   BUN 16 6 - 20 mg/dL   Creatinine, Ser 0.80 0.44 - 1.00 mg/dL   Calcium 9.1 8.9 - 10.3 mg/dL   Total Protein 6.4 (L) 6.5 - 8.1 g/dL   Albumin 3.9 3.5 - 5.0 g/dL   AST 24 15 - 41 U/L   ALT 20 14 - 54 U/L   Alkaline Phosphatase 60 38 - 126 U/L   Total Bilirubin 0.4 0.3 - 1.2 mg/dL   GFR calc non Af Amer >60 >60 mL/min   GFR calc Af Amer >60 >60 mL/min   Anion gap 7 5 - 15  I-Stat Chem 8, ED  (not at [facility], [facility])  Result Value Ref Range   Sodium 139 135 - 145 mmol/L   Potassium 4.3 3.5 - 5.1 mmol/L   Chloride 104 101 - 111 mmol/L   BUN 19 6 - 20 mg/dL    Creatinine, Ser 0.80 0.44 - 1.00 mg/dL   Glucose, Bld 106 (H) 65 - 99 mg/dL   Calcium, Ion 1.14 1.13 - 1.30 mmol/L   TCO2 21 0 - 100 mmol/L   Hemoglobin 13.9 12.0 - 15.0 g/dL   HCT 41.0 36.0 - 46.0 %  I-stat troponin, ED (not at [facility], [facility])  Result Value Ref Range   Troponin i, poc 0.00 0.00 - 0.08 ng/mL   Comment 3           Dg Chest 2 View 07/30/2014   CLINICAL DATA:  Dizziness and anxiety.  Emesis.  Acute onset.  EXAM: CHEST  2 VIEW  COMPARISON:  05/16/2005  FINDINGS: Normal mediastinum and cardiac silhouette. Normal pulmonary vasculature. No evidence of effusion, infiltrate, or pneumothorax. No acute bony abnormality.  IMPRESSION: No acute cardiopulmonary process.   Electronically Signed   By: Suzy Bouchard M.D.   On: 07/30/2014 15:21    1420:  Pt c/o left upper lip "numbness" since 1200. Pt extremely anxious, hyperventilating during my exam, crying and c/o nausea. T/C to Neuro Dr. Doy Mince, case discussed, including:  HPI, pertinent PM/SHx, VS/PE, dx testing, ED course and treatment:  Agrees that pt is not code stroke, requests to obtain CT-A head and neck in addition to tx pt for vertigo, she will consult.   1620:  Pt no longer having N/V after IV phenergan, and does appear calmer. Pt unable to stand for orthostatic VS. Pt given prep for IV contrast (states her allergy is "I got a rash when I had a CT scan a long time ago." CT's pending. Sign out to Dr. Tomi Bamberger.    Francine Graven, DO 07/30/14 1621

## 2014-07-30 NOTE — Progress Notes (Signed)
Report received from ED, RN 

## 2014-07-30 NOTE — ED Notes (Signed)
Pt unable to stand for orthostatic vital signs 

## 2014-07-30 NOTE — ED Provider Notes (Signed)
I discussed the findings with Dr. Doy Mince. She felt that the patient was able to ambulate and walk around without difficulty she could possibly go home. She was unable to do so MRI of the brain would be reasonable to evaluate further for possible occult stroke.  Findings with the patient. She is unable to get up and walk. I will consult the medical service regarding admission for observation.  Jasmine Rank, MD 07/30/14 [phone]

## 2014-07-30 NOTE — ED Notes (Signed)
CT aware that pt is ready for CT as she just received her medication

## 2014-07-30 NOTE — Progress Notes (Addendum)
Anesthesia Note:  Patient is a 80 year female scheduled for right mastectomy with sentinel LN biopsy on 08/05/14 by Dr. Excell Seltzer. Patient has requested a pre-operative anesthesia consult. There is mention that she is worried about lying flat. PAT appointment is scheduled for 07/31/14 at 1500.  However, she is currently roomed in the ED for vertigo, vomiting, and anxiety.   History includes right breast cancer, HTN, hypothyroidism, hysterectomy, tonsillectomy, cholecystectomy, non-smoker. Current BMI is consistent with obesity.  PCP is listed as Dr. Berneta Sages. HEM-ONC is Dr. Nicholas Lose.  Meds include Xanax, Benicar, levothyroxine, Toprol XL, MVI, Prilosec.  07/30/14 EKG: SR, artifact, particularly in leads I, aVL, and III.  Non-specific ST abnormality in V5-6 and avF, but may be due to slight baseline wanderer.  07/30/14 CXR: No acute cardiopulmonary process.  ED notes indicate that they are contemplating a CTA head/neck. (Her left upper lip felt numb but was also noted to be extremely anxious and hyperventilating.)  Labs from 07/30/14 1450 and 1459 noted.   I will plan to follow-up ED notes/test results tomorrow.  If she arrives for her PAT appointment tomorrow then I will plan to talk with her then.  [facility] Short Stay Center/Anesthesiology Phone [phone] 07/30/2014 5:23 PM  Addendum: Patient admitted overnight and discharged with a diagnosis of vertigo felt due to  benign positional vertigo with plans for out-patient PT. During her hospitalization she underwent CTA of the head/neck and MRI of the brain (see below) and was treated with meclizine and Epley's maneuver by PT.   07/31/14 MRI brain: IMPRESSION: No acute intracranial process. Involutional changes. Mild white matter changes suggest chronic small vessel ischemic disease, less than expected for age.  07/30/14 CTA head/neck:  IMPRESSION: 1. No acute intracranial abnormality. 2. Stable atrophy and white matter  disease. 3. Distal small vessel disease is present bilaterally in both the anterior and posterior circulation. 4. Moderate tortuosity of the cervical internal carotid arteries bilaterally without significant stenosis. 5. Subtle irregularity of the cervical internal carotid arteries bilaterally suggests the possibility of FMD. There is no associated dissection. 6. Tortuosity of the proximal left vertebral artery without significant stenosis. 7. Atherosclerotic changes within the cavernous internal carotid arteries without focal stenosis.  Follow-up labs on 07/31/14 also reviewed.   Patient was just discharged home this afternoon.  Our PAT RN was able to go talk with her prior to her discharge and give her pre-operative instructions.  Patient requests that I call her in the next few days to discuss her anesthesia concerns which primarily involve concerns about putting her head flat that may trigger her vertigo and nausea.  She was started on meclizine TID.  I will plan on calling her in the next few days.  [facility] Short Stay Center/Anesthesiology Phone [phone] 07/31/2014 3:21 PM

## 2014-07-30 NOTE — ED Notes (Signed)
Pt's O2 saturations are low, she is asleep, placed on nasal cannula and roused and pt breathing in through nose without difficulty

## 2014-07-30 NOTE — ED Notes (Signed)
Per EMS: got up this am and had trouble ambulating around the house. Then couple hours ago the room started spinning, vomiting.  She has been under stress lately.  Later on this week she is supposed to have right breast mastectomy and she is worried about laying flat on the hospital bed when she comes for this procedure.  Hasnt had an episode of vertigo in over four years. She doesn't sleep very well, over the past night she turned the wrong way in the bed, heard a pop and is worried that she set her vertigo off.  Worried about stroke because her lips are numb.  Equal grip strength, no facial droop, no arm drift.

## 2014-07-30 NOTE — ED Notes (Signed)
Pt left for XRay 

## 2014-07-31 ENCOUNTER — Observation Stay (HOSPITAL_COMMUNITY): Payer: Commercial Managed Care - HMO

## 2014-07-31 ENCOUNTER — Encounter (HOSPITAL_COMMUNITY): Payer: Self-pay | Admitting: General Practice

## 2014-07-31 ENCOUNTER — Encounter (HOSPITAL_COMMUNITY): Payer: Self-pay

## 2014-07-31 ENCOUNTER — Inpatient Hospital Stay (HOSPITAL_COMMUNITY)
Admission: RE | Admit: 2014-07-31 | Discharge: 2014-07-31 | Disposition: A | Discharge status: Home / Self Care | Payer: Commercial Managed Care - HMO | Source: Ambulatory Visit

## 2014-07-31 DIAGNOSIS — R42 Dizziness and giddiness: Secondary | ICD-10-CM | POA: Diagnosis not present

## 2014-07-31 DIAGNOSIS — Z883 Allergy status to other anti-infective agents status: Secondary | ICD-10-CM | POA: Diagnosis not present

## 2014-07-31 DIAGNOSIS — I10 Essential (primary) hypertension: Secondary | ICD-10-CM | POA: Diagnosis not present

## 2014-07-31 DIAGNOSIS — F419 Anxiety disorder, unspecified: Secondary | ICD-10-CM | POA: Diagnosis not present

## 2014-07-31 DIAGNOSIS — Z882 Allergy status to sulfonamides status: Secondary | ICD-10-CM | POA: Diagnosis not present

## 2014-07-31 DIAGNOSIS — Z79899 Other long term (current) drug therapy: Secondary | ICD-10-CM | POA: Diagnosis not present

## 2014-07-31 DIAGNOSIS — E039 Hypothyroidism, unspecified: Secondary | ICD-10-CM | POA: Diagnosis not present

## 2014-07-31 DIAGNOSIS — R112 Nausea with vomiting, unspecified: Secondary | ICD-10-CM | POA: Diagnosis not present

## 2014-07-31 DIAGNOSIS — R200 Anesthesia of skin: Secondary | ICD-10-CM | POA: Diagnosis not present

## 2014-07-31 HISTORY — DX: Plantar fascial fibromatosis: M72.2

## 2014-07-31 HISTORY — DX: Hypothyroidism, unspecified: E03.9

## 2014-07-31 HISTORY — DX: Restless legs syndrome: G25.81

## 2014-07-31 HISTORY — DX: Unspecified hearing loss, unspecified ear: H91.90

## 2014-07-31 LAB — COMPREHENSIVE METABOLIC PANEL
ALT: 18 U/L (ref 14–54)
ANION GAP: 10 (ref 5–15)
AST: 20 U/L (ref 15–41)
Albumin: 3.5 g/dL (ref 3.5–5.0)
Alkaline Phosphatase: 56 U/L (ref 38–126)
BILIRUBIN TOTAL: 0.4 mg/dL (ref 0.3–1.2)
BUN: 15 mg/dL (ref 6–20)
CHLORIDE: 105 mmol/L (ref 101–111)
CO2: 23 mmol/L (ref 22–32)
Calcium: 8.8 mg/dL — ABNORMAL LOW (ref 8.9–10.3)
Creatinine, Ser: 0.8 mg/dL (ref 0.44–1.00)
GFR calc non Af Amer: >60 mL/min (ref >60)
Glucose, Bld: 144 mg/dL — ABNORMAL HIGH (ref 65–99)
Potassium: 4.8 mmol/L (ref 3.5–5.1)
Sodium: 138 mmol/L (ref 135–145)
Total Protein: 6.5 g/dL (ref 6.5–8.1)

## 2014-07-31 LAB — CBC
HCT: 39.3 % (ref 36.0–46.0)
HEMOGLOBIN: 13.2 g/dL (ref 12.0–15.0)
MCH: 30.3 pg (ref 26.0–34.0)
MCHC: 33.6 g/dL (ref 30.0–36.0)
MCV: 90.1 fL (ref 78.0–100.0)
PLATELETS: 230 10*3/uL (ref 150–400)
RBC: 4.36 MIL/uL (ref 3.87–5.11)
RDW: 12.5 % (ref 11.5–15.5)
WBC: 11.4 10*3/uL — ABNORMAL HIGH (ref 4.0–10.5)

## 2014-07-31 MED ORDER — CEFUROXIME AXETIL 500 MG PO TABS: 500.0000 mg | ORAL_TABLET | Freq: Two times a day (BID) | ORAL | Status: DC

## 2014-07-31 MED ORDER — MECLIZINE HCL 12.5 MG PO TABS: 12.5000 mg | ORAL_TABLET | Freq: Three times a day (TID) | ORAL | Status: DC

## 2014-07-31 NOTE — Discharge Summary (Signed)
Physician Discharge Summary  [name]:[id number] DOB: August 25, 1934 DOA: 07/30/2014  PCP: Tivis Ringer, MD  Admit date: 07/30/2014 Discharge date: 07/31/2014  Time spent: 35 minutes  Recommendations for Outpatient Follow-up:  1. Patient set up for vestibular rehab in the outpatient setting on discharge   Discharge Diagnoses:  Principal Problem:   Vertigo Active Problems:   Breast cancer, right breast   Hypothyroidism   Essential hypertension   Discharge Condition: Stable/Improved  Diet recommendation: Heart Healthy  Filed Weights   07/30/14 1409  Weight: 83.008 kg (183 lb)    History of present illness:  [name] is a 80 y.o. female with a past medical history of vertigo, right breast cancer, hypothyroidism, hypertension, who was in her usual state of health until this morning when she woke up and felt unsteady. She had to walk holding onto furniture. Her last episode of vertigo was about 3 years ago. Over the last few months she's had extensive testing for breast cancer and the plan is to undergo right mastectomy on June 14. She has been very anxious as a result. This morning there was no spinning sensation, but later on in the day she has felt as if the room was spinning around her. She had multiple episodes of nausea and vomiting. She also experienced some numbness in the upper lip and nose, which has resolved. Denies any nausea, vomiting. No weakness on any one side of the body. She did have some pain in her right arm but denies any weakness. That pain has resolved. Denies any difficulty with speech. No chest pain or shortness of breath. Still feels unsteady. Emergency department providers have discussed with neurology who recommends MRI and observation.  Hospital Course:  Patient is a pleasant 80 year old female with a past medical history of vertigo, right breast cancer, admitted to the medicine service on 07/30/2014 presenting with complaints of dizziness. She  reported sensation of room spinning around her. She also complained of numbness across her lip. Workup included a CTA of head and neck which did not reveal acute intracranial abnormality. This was further worked up with an MRI of the brain that did not reveal acute intracranial process. Symptoms felt to be secondary to benign positional vertigo. She was treated with meclizine having subsequent improvement. By the following day she was able to ambulate around the nurse's station without any issues. She was evaluated by physical therapy who recommended outpatient vestibular exercises. She was discharged in stable condition on 07/31/2014.   Consultations:  Physical therapy  Case manager  Discharge Exam: Filed Vitals:   07/31/14 0523  BP: 105/47  Pulse: 79  Temp: 97.7 F (36.5 C)  Resp: 18    General: Patient is awake and alert, states feeling better. She ambulated down the hallway and back.  Cardiovascular: Regular rate and rhythm normal S1-S2 no murmurs rubs or gallops Respiratory: Clear to auscultation bilaterally no wheezing rhonchi or rales Abdomen: Soft nontender nondistended Extremities: No edema  Discharge Instructions   Discharge Instructions    Call MD for:  difficulty breathing, headache or visual disturbances    Complete by:  As directed      Call MD for:  extreme fatigue    Complete by:  As directed      Call MD for:  hives    Complete by:  As directed      Call MD for:  persistant dizziness or light-headedness    Complete by:  As directed  Call MD for:  persistant nausea and vomiting    Complete by:  As directed      Call MD for:  redness, tenderness, or signs of infection (pain, swelling, redness, odor or green/yellow discharge around incision site)    Complete by:  As directed      Call MD for:  severe uncontrolled pain    Complete by:  As directed      Call MD for:  temperature >100.4    Complete by:  As directed      Call MD for:    Complete by:  As  directed      Diet - low sodium heart healthy    Complete by:  As directed      Increase activity slowly    Complete by:  As directed           Current Discharge Medication List    START taking these medications   Details  cefUROXime (CEFTIN) 500 MG tablet Take 1 tablet (500 mg total) by mouth 2 (two) times daily with a meal. Qty: 8 tablet, Refills: 0    meclizine (ANTIVERT) 12.5 MG tablet Take 1 tablet (12.5 mg total) by mouth 3 (three) times daily. Qty: 20 tablet, Refills: 0      CONTINUE these medications which have NOT CHANGED   Details  acetaminophen (TYLENOL) 500 MG tablet Take 500 mg by mouth every 6 (six) hours as needed for mild pain or headache.    ALPRAZolam (XANAX) 0.5 MG tablet Take 0.5 mg by mouth 3 (three) times daily as needed for sleep or anxiety.    BENICAR 40 MG tablet Take 20 mg by mouth daily.     levothyroxine (SYNTHROID, LEVOTHROID) 50 MCG tablet Take 50 mcg by mouth every morning.    metoprolol succinate (TOPROL-XL) 100 MG 24 hr tablet Take 100 mg by mouth daily. Take with or immediately following a meal.    Multiple Vitamin (MULTIVITAMIN WITH MINERALS) TABS Take 1 tablet by mouth daily.    omeprazole (PRILOSEC) 20 MG capsule Take 20 mg by mouth daily.       Allergies  Allergen Reactions  . Iodine Rash  . Sulfa Antibiotics Rash   Follow-up Information    Follow up with Tivis Ringer, MD In 1 week.   Specialty:  Internal Medicine   Contact information:   [address] [place] [address] [phone]       Follow up with [facility].   Specialty:  Rehabilitation   Why:  they will contact you at home after discharge if you do not hear from them please call them   Contact information:   [address] [place] [phone]       The results of significant diagnostics from this hospitalization (including imaging, microbiology, ancillary  and laboratory) are listed below for reference.    Significant Diagnostic Studies: Ct Angio Head W/cm &/or Wo Cm  07/30/2014   CLINICAL DATA:  Acute onset of dizziness. Vertigo. Numbness in her lips.  EXAM: CT ANGIOGRAPHY HEAD AND NECK  TECHNIQUE: Multidetector CT imaging of the head and neck was performed using the standard protocol during bolus administration of intravenous contrast. Multiplanar CT image reconstructions and MIPs were obtained to evaluate the vascular anatomy. Carotid stenosis measurements (when applicable) are obtained utilizing NASCET criteria, using the distal internal carotid diameter as the denominator.  CONTRAST:  27mL OMNIPAQUE IOHEXOL 350 MG/ML SOLN  COMPARISON:  MRI brain 05/17/2005.  FINDINGS: CT HEAD  Brain: Mild atrophy and white matter changes are again noted. Basal ganglia are intact. Insular ribbon is normal. No acute cortical infarct is present. There is no acute hemorrhage or mass lesion.  Calvarium and skull base: Within normal limits  Paranasal sinuses: Cleared  Orbits: Bilateral lens replacements are noted. Globes and orbits are otherwise intact.  CTA NECK  Aortic arch: A 3 vessel arch configuration is present. No focal stenosis is evident at the great vessel origins.  Right carotid system: Right common carotid artery is normal. Bifurcation is unremarkable. Moderate tortuosity is present in the cervical right ICA. There is no significant stenosis relative to the more distal vessel.  Left carotid system: The left common carotid artery is within normal limits. The bifurcation is unremarkable. Moderate tortuosity is present in the cervical left ICA without significant stenosis. Mild vessel irregularity suggests possibility of FMD.  Vertebral arteries:The vertebral arteries both originate from the subclavian arteries. There is mild narrowing of the approximately 50% in the left vertebral artery tortuosity is worse on the left is well. Dental artifact partially obscures the  vertebral arteries at the C2-3 level. There are no focal vertebral artery lesions in the neck.  Skeleton: There straightening and some reversal of the normal cervical lordosis. Multilevel endplate degenerative change in uncovertebral spurring is noted, most prominent from C4-5 through C6-7. The facets are aligned. No focal lytic or blastic lesions are evident.  Other neck: The dominant left thyroid mass enhances heterogeneously. The lesion measures 3.0 x 2.5 x 3.1 cm. No focal mucosal or submucosal lesions are present. There is no significant adenopathy. The salivary glands are within normal limits.  CTA HEAD  Anterior circulation: Atherosclerotic calcifications are present within the cavernous internal carotid arteries bilaterally. There is no focal stenosis. An infundibulum is noted at the left posterior communicating artery. The A1 and M1 segments are normal. The anterior communicating artery is patent. The MCA bifurcations are intact. Mild narrowing of distal MCA and ACA branch vessels is present without a significant proximal stenosis, aneurysm, or branch vessel occlusion.  Posterior circulation: The vertebral arteries are codominant. PICA origins are visualized and normal. The vertebrobasilar junction is within normal limits. Prominent AICA vessels are noted bilaterally. Both posterior cerebral arteries originate from the basilar tip. There is some attenuation of distal PCA branch vessels bilaterally.  Venous sinuses: The dural sinuses are patent. The right transverse sinus is dominant. The straight sinus and deep cerebral veins are patent.  Anatomic variants: None  Delayed phase: The postcontrast images the brain demonstrate no pathologic enhancement.  IMPRESSION: 1. No acute intracranial abnormality. 2. Stable atrophy and white matter disease. 3. Distal small vessel disease is present bilaterally in both the anterior and posterior circulation. 4. Moderate tortuosity of the cervical internal carotid arteries  bilaterally without significant stenosis. 5. Subtle irregularity of the cervical internal carotid arteries bilaterally suggests the possibility of FMD. There is no associated dissection. 6. Tortuosity of the proximal left vertebral artery without significant stenosis. 7. Atherosclerotic changes within the cavernous internal carotid arteries without focal stenosis.   Electronically Signed   By: San Morelle M.D.   On: 07/30/2014 18:17   Dg Chest 2 View  07/30/2014   CLINICAL DATA:  Dizziness and anxiety.  Emesis.  Acute onset.  EXAM: CHEST  2 VIEW  COMPARISON:  05/16/2005  FINDINGS: Normal mediastinum and cardiac silhouette. Normal pulmonary vasculature. No evidence of effusion, infiltrate, or pneumothorax. No acute bony abnormality.  IMPRESSION: No acute cardiopulmonary  process.   Electronically Signed   By: Suzy Bouchard M.D.   On: 07/30/2014 15:21   Ct Angio Neck W/cm &/or Wo/cm  07/30/2014   CLINICAL DATA:  Acute onset of dizziness. Vertigo. Numbness in her lips.  EXAM: CT ANGIOGRAPHY HEAD AND NECK  TECHNIQUE: Multidetector CT imaging of the head and neck was performed using the standard protocol during bolus administration of intravenous contrast. Multiplanar CT image reconstructions and MIPs were obtained to evaluate the vascular anatomy. Carotid stenosis measurements (when applicable) are obtained utilizing NASCET criteria, using the distal internal carotid diameter as the denominator.  CONTRAST:  71mL OMNIPAQUE IOHEXOL 350 MG/ML SOLN  COMPARISON:  MRI brain 05/17/2005.  FINDINGS: CT HEAD  Brain: Mild atrophy and white matter changes are again noted. Basal ganglia are intact. Insular ribbon is normal. No acute cortical infarct is present. There is no acute hemorrhage or mass lesion.  Calvarium and skull base: Within normal limits  Paranasal sinuses: Cleared  Orbits: Bilateral lens replacements are noted. Globes and orbits are otherwise intact.  CTA NECK  Aortic arch: A 3 vessel arch  configuration is present. No focal stenosis is evident at the great vessel origins.  Right carotid system: Right common carotid artery is normal. Bifurcation is unremarkable. Moderate tortuosity is present in the cervical right ICA. There is no significant stenosis relative to the more distal vessel.  Left carotid system: The left common carotid artery is within normal limits. The bifurcation is unremarkable. Moderate tortuosity is present in the cervical left ICA without significant stenosis. Mild vessel irregularity suggests possibility of FMD.  Vertebral arteries:The vertebral arteries both originate from the subclavian arteries. There is mild narrowing of the approximately 50% in the left vertebral artery tortuosity is worse on the left is well. Dental artifact partially obscures the vertebral arteries at the C2-3 level. There are no focal vertebral artery lesions in the neck.  Skeleton: There straightening and some reversal of the normal cervical lordosis. Multilevel endplate degenerative change in uncovertebral spurring is noted, most prominent from C4-5 through C6-7. The facets are aligned. No focal lytic or blastic lesions are evident.  Other neck: The dominant left thyroid mass enhances heterogeneously. The lesion measures 3.0 x 2.5 x 3.1 cm. No focal mucosal or submucosal lesions are present. There is no significant adenopathy. The salivary glands are within normal limits.  CTA HEAD  Anterior circulation: Atherosclerotic calcifications are present within the cavernous internal carotid arteries bilaterally. There is no focal stenosis. An infundibulum is noted at the left posterior communicating artery. The A1 and M1 segments are normal. The anterior communicating artery is patent. The MCA bifurcations are intact. Mild narrowing of distal MCA and ACA branch vessels is present without a significant proximal stenosis, aneurysm, or branch vessel occlusion.  Posterior circulation: The vertebral arteries are  codominant. PICA origins are visualized and normal. The vertebrobasilar junction is within normal limits. Prominent AICA vessels are noted bilaterally. Both posterior cerebral arteries originate from the basilar tip. There is some attenuation of distal PCA branch vessels bilaterally.  Venous sinuses: The dural sinuses are patent. The right transverse sinus is dominant. The straight sinus and deep cerebral veins are patent.  Anatomic variants: None  Delayed phase: The postcontrast images the brain demonstrate no pathologic enhancement.  IMPRESSION: 1. No acute intracranial abnormality. 2. Stable atrophy and white matter disease. 3. Distal small vessel disease is present bilaterally in both the anterior and posterior circulation. 4. Moderate tortuosity of the cervical internal carotid arteries bilaterally without  significant stenosis. 5. Subtle irregularity of the cervical internal carotid arteries bilaterally suggests the possibility of FMD. There is no associated dissection. 6. Tortuosity of the proximal left vertebral artery without significant stenosis. 7. Atherosclerotic changes within the cavernous internal carotid arteries without focal stenosis.   Electronically Signed   By: San Morelle M.D.   On: 07/30/2014 18:17   Mr Brain Wo Contrast  07/31/2014   CLINICAL DATA:  Vertigo beginning this morning, nausea and vomiting. History of breast cancer, vertigo, hypertension.  EXAM: MRI HEAD WITHOUT CONTRAST  TECHNIQUE: Multiplanar, multiecho pulse sequences of the brain and surrounding structures were obtained without intravenous contrast.  COMPARISON:  CT head July 30, 2014 and MRI brain May 17, 2005  FINDINGS: No reduced diffusion to suggest acute ischemia. No susceptibility artifact is suggests hemorrhage. Ventricles and sulci are normal for patient's age. Scattered subcentimeter supratentorial white matter T2 hyperintensities are less than expected for age. No midline shift, mass effect or mass lesions.  Tiny perivascular spaces in the basal ganglia.  No abnormal extra-axial fluid collections. Normal major intracranial vascular flow voids seen at the skull base. Bilateral ocular lens implants. The paranasal sinuses and mastoid air cells are well aerated. Moderate temporomandibular osteoarthrosis. No abnormal sellar expansion. No cerebellar tonsillar ectopia. No suspicious calvarial bone marrow signal.  IMPRESSION: No acute intracranial process.  Involutional changes. Mild white matter changes suggest chronic small vessel ischemic disease, less than expected for age.   Electronically Signed   By: Elon Alas M.D.   On: 07/31/2014 05:19    Microbiology: No results found for this or any previous visit (from the past 240 hour(s)).   Labs: Basic Metabolic Panel:  Recent Labs Lab 07/30/14 1450 07/30/14 1459 07/31/14 0557  NA 139 139 138  K 4.4 4.3 4.8  CL 106 104 105  CO2 26  --  23  GLUCOSE 110* 106* 144*  BUN 16 19 15   CREATININE 0.80 0.80 0.80  CALCIUM 9.1  --  8.8*   Liver Function Tests:  Recent Labs Lab 07/30/14 1450 07/31/14 0557  AST 24 20  ALT 20 18  ALKPHOS 60 56  BILITOT 0.4 0.4  PROT 6.4* 6.5  ALBUMIN 3.9 3.5   No results for input(s): LIPASE, AMYLASE in the last 168 hours. No results for input(s): AMMONIA in the last 168 hours. CBC:  Recent Labs Lab 07/30/14 1450 07/30/14 1459 07/31/14 0557  WBC 7.5  --  11.4*  NEUTROABS 5.2  --   --   HGB 13.2 13.9 13.2  HCT 39.3 41.0 39.3  MCV 89.9  --  90.1  PLT 209  --  230   Cardiac Enzymes: No results for input(s): CKTOTAL, CKMB, CKMBINDEX, TROPONINI in the last 168 hours. BNP: BNP (last 3 results) No results for input(s): BNP in the last 8760 hours.  ProBNP (last 3 results) No results for input(s): PROBNP in the last 8760 hours.  CBG: No results for input(s): GLUCAP in the last 168 hours.     SignedKelvin Cellar  [facility] 07/31/2014, 12:25 PM

## 2014-07-31 NOTE — Pre-Procedure Instructions (Signed)
    [name]  07/31/2014        Your procedure is scheduled on Tuesday, June 14.  Report to [facility] Admitting at 5:30A.M.   Call this number if you have problems the morning of surgery: [phone]              For any other questions, please call [phone], Monday - Friday 8 AM - 4 PM.     Remember:  Do not eat food or drink liquids after midnight Monday, June 13.  Take these medicines the morning of surgery with A SIP OF WATER : levothyroxine (SYNTHROID, LEVOTHROID), metoprolol succinate (TOPROL-XL), omeprazole (PRILOSEC), meclizine (ANTIVERT).               Take Tylenol and Xanax if needed.               Stop Vitamins.  Do not take Aspirin, Aspirin Products, Ibuprofen (Advil), Naproxen (Aleve) or Herbal Products.   Do not wear jewelry, make-up or nail polish.  Do not wear lotions, powders, or perfumes.    Do not shave 48 hours prior to surgery.    Do not bring valuables to the hospital.  [facility] is not responsible for any belongings or valuables.  Contacts, dentures or bridgework may not be worn into surgery.  Leave your suitcase in the car.  After surgery it may be brought to your room.  For patients admitted to the hospital, discharge time will be determined by your treatment team.  Patients discharged the day of surgery will not be allowed to drive home.         Special Instructions: Review  [facility] - Preparing For Surgery.  Please read over the following fact sheets that you were given. Pain Booklet, Coughing and Deep Breathing and Surgical Site Infection Prevention

## 2014-07-31 NOTE — Evaluation (Addendum)
Physical Therapy Evaluation Patient Details Name: [name] MRN: [id number] DOB: [birth year] Today's Date: 07/31/2014   History of Present Illness  [name] is a 80 y.o. female with a past medical history of vertigo, right breast cancer, hypothyroidism, hypertension. Admitted with vertigo type symptoms and numbness of nose and lips.  Clinical Impression  Pt admitted with the above complication. Pt currently with functional limitations due to the deficits listed below (see PT Problem List). [name] has a history of BPPV and tested strongly positive for left posterior canalithiasis today; she was treated with Epley's maneuver and reported an immediate improvement in her symptoms. She may require an additional treatment if symptoms return or persist, which can be completed on an outpatient basis. She is familiar with clinics in the area that provide this type of treatment. Ambulates generally well up to 200 feet today and will have 24/7 supervision from husband as needed. Adequate for d/c from PT standpoint when medically ready. Will follow until d/c.       Follow Up Recommendations Outpatient PT (for vestibular rehab)    Equipment Recommendations  None recommended by PT    Recommendations for Other Services       Precautions / Restrictions Precautions Precautions: Fall Restrictions Weight Bearing Restrictions: No      Mobility  Bed Mobility Overal bed mobility: Independent                Transfers Overall transfer level: Needs assistance Equipment used: None Transfers: Sit to/from Stand Sit to Stand: Supervision         General transfer comment: supervision for safety. Mildly unstable with reported dizziness however no loss of balance requiring physical assist.  Ambulation/Gait Ambulation/Gait assistance: Supervision Ambulation Distance (Feet): 200 Feet Assistive device: None Gait Pattern/deviations: Step-through pattern;Decreased stride  length;Staggering left;Drifts right/left Gait velocity: decreased   General Gait Details: Supervision for safety. Very mild drift to lt and rt at times with slight stagger. States she typically wears orthotics which are not available today. No overt loss of balance requiring assist to correct. VC for awareness. Able to perform quick turns safely.  Stairs            Wheelchair Mobility    Modified Rankin (Stroke Patients Only)       Balance Overall balance assessment: Needs assistance Sitting-balance support: No upper extremity supported;Feet supported Sitting balance-Leahy Scale: Normal     Standing balance support: No upper extremity supported Standing balance-Leahy Scale: Good                               Pertinent Vitals/Pain Pain Assessment: 0-10 Pain Location: headache Pain Descriptors / Indicators: Aching Pain Intervention(s): Monitored during session;Repositioned    Home Living Family/patient expects to be discharged to:: Private residence Living Arrangements: Spouse/significant other Available Help at Discharge: Family;Available 24 hours/day Type of Home: House Home Access: Stairs to enter Entrance Stairs-Rails: Psychiatric nurse of Steps: 2 +1 Home Layout: One level Home Equipment: None      Prior Function Level of Independence: Independent               Hand Dominance        Extremity/Trunk Assessment   Upper Extremity Assessment: Defer to OT evaluation           Lower Extremity Assessment: Overall WFL for tasks assessed         Communication   Communication: No difficulties  Cognition Arousal/Alertness: Awake/alert Behavior During Therapy: WFL for tasks assessed/performed Overall Cognitive Status: Within Functional Limits for tasks assessed                      General Comments General comments (skin integrity, edema, etc.): Hx of BPPV last treated by Epley's maneuver per patient report  approx 3-4 years ago. Pt demonstrated ageotropic nystagmus during log roll test suggestive of Lt horizontal canalithiasis; treated with BBQ roll technique. Pt most positive with Dix-hallpike test - left upward and rotational nystagums, sx of dizziness/nausea, resolved in <1 minute; treated with Epley's maneuver. Pt reported dizziness resolved following procedure. Pt already familiar with BPPV although recommended follow up with OPPT clinic if symptoms persist.    Exercises        Assessment/Plan    PT Assessment Patient needs continued PT services  PT Diagnosis Abnormality of gait   PT Problem List Decreased strength;Decreased balance;Decreased mobility;Other (comment) (dizziness)  PT Treatment Interventions DME instruction;Gait training;Functional mobility training;Therapeutic activities;Therapeutic exercise;Balance training;Other (comment) (canalith repositioning)   PT Goals (Current goals can be found in the Care Plan section) Acute Rehab PT Goals Patient Stated Goal: no dizziness PT Goal Formulation: With patient Time For Goal Achievement: 08/14/14 Potential to Achieve Goals: Good Additional Goals Additional Goal #1:  (Will report episodes of dizziness has resolved)    Frequency Min 3X/week   Barriers to discharge        Co-evaluation               End of Session   Activity Tolerance: Patient tolerated treatment well Patient left: in chair;with call bell/phone within reach Nurse Communication: Mobility status    Functional Assessment Tool Used: clinical observation Functional Limitation: Mobility: Walking and moving around Mobility: Walking and Moving Around Current Status (J1791): At least 1 percent but less than 20 percent impaired, limited or restricted Mobility: Walking and Moving Around Goal Status [phone]): At least 1 percent but less than 20 percent impaired, limited or restricted    Time: [phone] PT Time Calculation (min) (ACUTE ONLY): 40  min   Charges:   PT Evaluation $Initial PT Evaluation Tier I: 1 Procedure PT Treatments $Gait Training: 8-22 mins $Canalith Rep Proc: 8-22 mins   PT G Codes:   PT G-Codes **NOT FOR INPATIENT CLASS** Functional Assessment Tool Used: clinical observation Functional Limitation: Mobility: Walking and moving around Mobility: Walking and Moving Around Current Status (V3748): At least 1 percent but less than 20 percent impaired, limited or restricted Mobility: Walking and Moving Around Goal Status [phone]): At least 1 percent but less than 20 percent impaired, limited or restricted    Ellouise Newer 07/31/2014, 10:38 AM Elayne Snare, [place]

## 2014-07-31 NOTE — Progress Notes (Signed)
[name] was an inpatient , being discharged on the day of her PAT appointment.  I saw patient in her inpatient room and did her interview and instructions there. Patient is concerned about having anesthesia because she experiences vertigo when her head is flat and she then has nausea.  Patient said that someone from anesthesia can call her on Friday. Patient was also concerned if surgery will be done on the day that it is scheduled since she was having the problem of vertigo.

## 2014-07-31 NOTE — Pre-Procedure Instructions (Signed)
    [name]  07/31/2014        Your procedure is scheduled on Tuesday, June 14.  Report to [facility] Admitting at 5:30A.M.   Call this number if you have problems the morning of surgery: [phone]              For any other questions, please call [phone], Monday - Friday 8 AM - 4 PM.     Remember:  Do not eat food or drink liquids after midnight Monday, June 13.  Take these medicines the morning of surgery with A SIP OF WATER : levothyroxine (SYNTHROID, LEVOTHROID), metoprolol succinate (TOPROL-XL), omeprazole (PRILOSEC).               Take Tylenol and Xanax if needed.               Stop Vitamins.  Do not take Aspirin, Aspirin Products, Ibuprofen (Advil), Naproxen (Aleve) or Herbal Products.   Do not wear jewelry, make-up or nail polish.  Do not wear lotions, powders, or perfumes.    Do not shave 48 hours prior to surgery.    Do not bring valuables to the hospital.  [facility] is not responsible for any belongings or valuables.  Contacts, dentures or bridgework may not be worn into surgery.  Leave your suitcase in the car.  After surgery it may be brought to your room.  For patients admitted to the hospital, discharge time will be determined by your treatment team.  Patients discharged the day of surgery will not be allowed to drive home.         Special Instructions: Review  [facility] - Preparing For Surgery.  Please read over the following fact sheets that you were given. Pain Booklet, Coughing and Deep Breathing and Surgical Site Infection Prevention

## 2014-07-31 NOTE — Progress Notes (Signed)
Nsg Discharge Note  Admit Date:  07/30/2014 Discharge date: 07/31/2014   [name] to be D/C'd Home per MD order.  AVS completed.  Copy for chart, and copy for patient signed, and dated. Patient/caregiver able to verbalize understanding.  Discharge Medication:   Medication List    TAKE these medications        acetaminophen 500 MG tablet  Commonly known as:  TYLENOL  Take 500 mg by mouth every 6 (six) hours as needed for mild pain or headache.     ALPRAZolam 0.5 MG tablet  Commonly known as:  XANAX  Take 0.5 mg by mouth 3 (three) times daily as needed for sleep or anxiety.     BENICAR 40 MG tablet  Generic drug:  olmesartan  Take 20 mg by mouth daily.     cefUROXime 500 MG tablet  Commonly known as:  CEFTIN  Take 1 tablet (500 mg total) by mouth 2 (two) times daily with a meal.     levothyroxine 50 MCG tablet  Commonly known as:  SYNTHROID, LEVOTHROID  Take 50 mcg by mouth every morning.     meclizine 12.5 MG tablet  Commonly known as:  ANTIVERT  Take 1 tablet (12.5 mg total) by mouth 3 (three) times daily.     metoprolol succinate 100 MG 24 hr tablet  Commonly known as:  TOPROL-XL  Take 100 mg by mouth daily. Take with or immediately following a meal.     multivitamin with minerals Tabs tablet  Take 1 tablet by mouth daily.     omeprazole 20 MG capsule  Commonly known as:  PRILOSEC  Take 20 mg by mouth daily.        Discharge Assessment: Filed Vitals:   07/31/14 1359  BP:   Pulse:   Temp: 98.1 F (36.7 C)  Resp:    Skin clean, dry and intact without evidence of skin break down, no evidence of skin tears noted. IV catheter discontinued intact. Site without signs and symptoms of complications - no redness or edema noted at insertion site, patient denies c/o pain - only slight tenderness at site.  Dressing with slight pressure applied.  D/c Instructions-Education: Discharge instructions given to patient/family with verbalized understanding. D/c  education completed with patient/family including follow up instructions, medication list, d/c activities limitations if indicated, with other d/c instructions as indicated by MD - patient able to verbalize understanding, all questions fully answered. Patient instructed to return to ED, call 911, or call MD for any changes in condition.  Patient escorted via [place], and D/C home via private auto.  Dayle Points, RN 07/31/2014 2:22 PM

## 2014-07-31 NOTE — Pre-Procedure Instructions (Signed)
    [name]  07/31/2014        Your procedure is scheduled on Tuesday, June 14.  Report to [facility] Admitting at 5:30A.M.   Call this number if you have problems the morning of surgery: [phone]              For any other questions, please call [phone], Monday - Friday 8 AM - 4 PM.     Remember:  Do not eat food or drink liquids after midnight Monday, June 13.  Take these medicines the morning of surgery with A SIP OF WATER : levothyroxine (SYNTHROID, LEVOTHROID), metoprolol succinate (TOPROL-XL), omeprazole (PRILOSEC), meclizine (ANTIVERT).               Take Tylenol and Xanax if needed.               Stop Vitamins.  Do not take Aspirin, Aspirin Products, Ibuprofen (Advil), Naproxen (Aleve) or Herbal Products.   Do not wear jewelry, make-up or nail polish.  Do not wear lotions, powders, or perfumes.    Do not shave 48 hours prior to surgery.    Do not bring valuables to the hospital.  [facility] is not responsible for any belongings or valuables.  Contacts, dentures or bridgework may not be worn into surgery.  Leave your suitcase in the car.  After surgery it may be brought to your room.  For patients admitted to the hospital, discharge time will be determined by your treatment team.  Patients discharged the day of surgery will not be allowed to drive home.         Special Instructions: Review  [facility] - Preparing For Surgery.  Please read over the following fact sheets that you were given. Pain Booklet, Coughing and Deep Breathing and Surgical Site Infection Prevention

## 2014-08-01 ENCOUNTER — Telehealth: Payer: Self-pay | Admitting: Hematology and Oncology

## 2014-08-01 ENCOUNTER — Other Ambulatory Visit: Payer: Self-pay

## 2014-08-01 LAB — URINE CULTURE: Colony Count: 9000

## 2014-08-01 NOTE — Telephone Encounter (Signed)
Confirmed appointment for 06/27

## 2014-08-04 ENCOUNTER — Encounter (HOSPITAL_COMMUNITY): Payer: Self-pay | Admitting: Vascular Surgery

## 2014-08-04 NOTE — Progress Notes (Signed)
Anesthesia follow-up:  See my note from 07/31/14.  Patient was discharged home that day after admission for vertigo (BPPV).  She requested anesthesia representative contact her to discuss her concerns regarding perioperative positioning that could trigger her vertigo--particularly putting her head down.  Willeen Cass, FNP-BC called and spoke with patient on 08/01/14 to discuss patient's concerns.  Patient will discuss further with her assigned anesthesiologist on the day of surgery. PACU staff will also need to be aware, so I documented her concerns under "anesthetic complications."  George Hugh [facility] Short Stay Center/Anesthesiology Phone [phone] 08/04/2014 9:49 AM

## 2014-08-04 NOTE — H&P (Signed)
History of Present Illness Marland Kitchen T. Athony Coppa MD; 07/24/2014 12:51 PM) The patient is a 80 year old female who presents with breast cancer. Patint is a 80 YO postmenopausal female referred by Dr. Pamelia Hoit for evaluation of recently diagnosed carcinoma of the right breast. She has a history of an abnormal mammogram one year ago with a small right breast mass that could not be seen well enough for biopsy. Six-month follow-up resulted in the same findings. She recently presented for planned follow-up for this mass. Subsequent imaging included diagnostic mamogram showing enlargement of the previously described mass in the right breast. Ultrasound was performed showing an irregular mixed echogenicity mass at the 6 o'clock position 3-4 cm from the nipple measuring 8 mm in greatest diameter. An additional irregular hypoechoic mass was identified at the 7 o'clock position 4 cm from the nipple measuring 6 mm in greatest diameter and a third hypoechoic mass identified at 634 cm from the nipple measuring 3 mm and thought to correspond to the mass identified a year ago. An ultrasound guided breast biopsy was performed with pathology revealing invasive lobular carcinoma, grade 2, ER/PR positive at the mass at 7:00 and grade 1-2 invasive ductal carcinoma at the mass at the 6 o'clock position, also ER/PR positive and low-grade. Subsequent bilateral breast MRI was performed showing a fairly large area of abnormal enhancement behind the right nipple. Large core biopsy of this was performed which has just returned revealing lobular carcinoma in situ. She is seen now in the office for initial treatment planning. She has experienced no breast symptoms specifically pain or lump or nipple discharge. She does not have a personal history of any previous breast problems. She has a history of one sister with breast cancer and another sister with apparent precancerous changes who underwent mastectomy as well as a niece with  breast cancer Findings at that time were the following: Tumor size: 8 mm and 6 mm and LCIS over 5-6 cm cm Tumor grade: 1 Estrogen Receptor: Positive Progesterone Receptor: Positive Her-2 neu: Negative Lymph node status: Negative She subsequently has had genetic testing which was negative. She has had a plastic surgery consultation and has decided against reconstruction. She does not want a contralateral mastectomy which I think is the most reasonable choice.   Allergies Jeralyn Ruths, CMA; 07/24/2014 12:21 PM) Sulfa 10 *OPHTHALMIC AGENTS* Iodine *ANTISEPTICS & DISINFECTANTS*  Medication History Jeralyn Ruths, CMA; 07/24/2014 12:21 PM) Xanax XR (0.5MG Tablet ER 24HR, Oral as directed) Active. Benicar (40MG Tablet, Oral daily) Active. Toprol XL (100MG Tablet ER 24HR, Oral daily) Active. Multivitamin (Oral daily) Active. PriLOSEC (20MG Capsule DR, Oral as needed) Active. Medications Reconciled  Vitals Jearld Fenton Morris CMA; 07/24/2014 12:28 PM) 07/24/2014 12:27 PM Weight: 183 lb Height: 60in Body Surface Area: 1.87 m Body Mass Index: 35.74 kg/m Temp.: 98.53F(Oral)  Pulse: 76 (Regular)  BP: 150/80 (Sitting, Left Arm, Standard)    Physical Exam Marland Kitchen T. Casten Floren MD; 07/24/2014 12:52 PM) The physical exam findings are as follows: Note:General: Alert, well-developed and well nourished Caucasian female, in no distress Skin: Warm and dry without rash or infection. HEENT: No palpable masses or thyromegaly. Sclera nonicteric. Pupils equal round and reactive. Oropharynx clear. Lymph nodes: No cervical, supraclavicular, or inguinal nodes palpable. Breasts: Bruising right breast post biopsy. Some tenderness but no discernible masses. Left breast is negative. Moderate ptosis. No palpable axillary adenopathy. Lungs: Breath sounds clear and equal. No wheezing or increased work of breathing. Cardiovascular: Regular rate and rhythm without murmer. No JVD  or edema. Peripheral pulses  intact. No carotid bruits. Abdomen: Nondistended. Soft and nontender. No masses palpable. No organomegaly. No palpable hernias. Extremities: No edema or joint swelling or deformity. No chronic venous stasis changes. Neurologic: Alert and fully oriented. Gait normal. No focal weakness. Psychiatric: Normal mood and affect. Thought content appropriate with normal judgement and insight     Assessment & Plan Marland Kitchen T. Mavric Cortright MD; 07/24/2014 12:53 PM) BREAST CANCER, RIGHT (174.9  C50.911) Impression: 80 year old female with a new diagnosis of cancer of the right breast, multicentric with 2 separate small low-grade tumors, one lobular N1 ductal in the lower outer quadrant as well as a fairly large area of LCIS in the central retroareolar area. Clinical stage 1A, ER positive PR positive, HER-2 negative. I discussed with the patient and family members present today initial surgical treatment options. We discussed options of breast conservation with lumpectomy or total mastectomy and sentinal lymph node biopsy/dissection. Options for reconstruction were discussed. She has multicentric cancer and I believe with 2 invasive tumors and a large area of LCIS in the central breast that she would be best served with total mastectomy and she and her daughter are in agreement. Current Plans  Schedule for Surgery Right total mastectomy with axillary sentinel lymph node biopsy under general anesthesia with an overnight hospitalization

## 2014-08-05 ENCOUNTER — Encounter (HOSPITAL_COMMUNITY)
Admission: RE | Disposition: A | Discharge status: Home / Self Care | Payer: Self-pay | Source: Ambulatory Visit | Attending: General Surgery

## 2014-08-05 ENCOUNTER — Encounter (HOSPITAL_COMMUNITY)
Admission: RE | Admit: 2014-08-05 | Discharge: 2014-08-05 | Disposition: A | Discharge status: Home / Self Care | Payer: Commercial Managed Care - HMO | Source: Ambulatory Visit | Attending: General Surgery | Admitting: General Surgery

## 2014-08-05 ENCOUNTER — Ambulatory Visit (HOSPITAL_COMMUNITY): Payer: Commercial Managed Care - HMO | Admitting: Vascular Surgery

## 2014-08-05 ENCOUNTER — Observation Stay (HOSPITAL_COMMUNITY)
Admission: RE | Admit: 2014-08-05 | Discharge: 2014-08-06 | Disposition: A | Discharge status: Home / Self Care | Payer: Commercial Managed Care - HMO | Source: Ambulatory Visit | Attending: General Surgery | Admitting: General Surgery

## 2014-08-05 ENCOUNTER — Encounter (HOSPITAL_COMMUNITY): Payer: Self-pay | Admitting: *Deleted

## 2014-08-05 DIAGNOSIS — E039 Hypothyroidism, unspecified: Secondary | ICD-10-CM | POA: Diagnosis not present

## 2014-08-05 DIAGNOSIS — Z882 Allergy status to sulfonamides status: Secondary | ICD-10-CM | POA: Diagnosis not present

## 2014-08-05 DIAGNOSIS — Z170 Estrogen receptor positive status [ER+]: Secondary | ICD-10-CM | POA: Diagnosis not present

## 2014-08-05 DIAGNOSIS — C50811 Malignant neoplasm of overlapping sites of right female breast: Principal | ICD-10-CM | POA: Insufficient documentation

## 2014-08-05 DIAGNOSIS — G8918 Other acute postprocedural pain: Secondary | ICD-10-CM | POA: Diagnosis not present

## 2014-08-05 DIAGNOSIS — Z91041 Radiographic dye allergy status: Secondary | ICD-10-CM | POA: Insufficient documentation

## 2014-08-05 DIAGNOSIS — I10 Essential (primary) hypertension: Secondary | ICD-10-CM | POA: Insufficient documentation

## 2014-08-05 DIAGNOSIS — C50911 Malignant neoplasm of unspecified site of right female breast: Secondary | ICD-10-CM | POA: Insufficient documentation

## 2014-08-05 DIAGNOSIS — Z803 Family history of malignant neoplasm of breast: Secondary | ICD-10-CM | POA: Insufficient documentation

## 2014-08-05 DIAGNOSIS — K449 Diaphragmatic hernia without obstruction or gangrene: Secondary | ICD-10-CM | POA: Diagnosis not present

## 2014-08-05 DIAGNOSIS — Z9011 Acquired absence of right breast and nipple: Secondary | ICD-10-CM | POA: Insufficient documentation

## 2014-08-05 HISTORY — DX: Adverse effect of unspecified anesthetic, initial encounter: T41.45XA

## 2014-08-05 HISTORY — PX: MASTECTOMY W/ SENTINEL NODE BIOPSY: SHX2001

## 2014-08-05 HISTORY — DX: Other complications of anesthesia, initial encounter: T88.59XA

## 2014-08-05 SURGERY — MASTECTOMY WITH SENTINEL LYMPH NODE BIOPSY
Anesthesia: Regional | Site: Breast | Laterality: Right

## 2014-08-05 MED ORDER — TECHNETIUM TC 99M SULFUR COLLOID FILTERED
1.0000 | Freq: Once | INTRAVENOUS | Status: AC | PRN
  Administered 2014-08-05: 1 via INTRADERMAL

## 2014-08-05 MED ORDER — LIDOCAINE HCL (CARDIAC) 20 MG/ML IV SOLN
INTRAVENOUS | Status: AC
  Filled 2014-08-05: qty 5

## 2014-08-05 MED ORDER — LACTATED RINGERS IV SOLN
INTRAVENOUS | Status: DC | PRN
  Administered 2014-08-05 (×2): via INTRAVENOUS

## 2014-08-05 MED ORDER — MECLIZINE HCL 12.5 MG PO TABS
12.5000 mg | ORAL_TABLET | Freq: Three times a day (TID) | ORAL | Status: DC
  Administered 2014-08-05 – 2014-08-06 (×3): 12.5 mg via ORAL
  Filled 2014-08-05 (×6): qty 1

## 2014-08-05 MED ORDER — METOPROLOL SUCCINATE ER 100 MG PO TB24
100.0000 mg | ORAL_TABLET | Freq: Every day | ORAL | Status: DC
  Filled 2014-08-05 (×2): qty 1

## 2014-08-05 MED ORDER — IRBESARTAN 300 MG PO TABS
300.0000 mg | ORAL_TABLET | Freq: Every day | ORAL | Status: DC
  Administered 2014-08-05: 300 mg via ORAL
  Filled 2014-08-05 (×2): qty 1

## 2014-08-05 MED ORDER — CHLORHEXIDINE GLUCONATE 4 % EX LIQD: 1.0000 "application " | Freq: Once | CUTANEOUS | Status: DC

## 2014-08-05 MED ORDER — FENTANYL CITRATE (PF) 100 MCG/2ML IJ SOLN
INTRAMUSCULAR | Status: DC | PRN
  Administered 2014-08-05 (×2): 50 ug via INTRAVENOUS
  Administered 2014-08-05: 25 ug via INTRAVENOUS

## 2014-08-05 MED ORDER — ONDANSETRON HCL 4 MG/2ML IJ SOLN
INTRAMUSCULAR | Status: DC | PRN
  Administered 2014-08-05 (×2): 4 mg via INTRAVENOUS

## 2014-08-05 MED ORDER — MORPHINE SULFATE 2 MG/ML IJ SOLN: 2.0000 mg | INTRAMUSCULAR | Status: DC | PRN

## 2014-08-05 MED ORDER — DEXAMETHASONE SODIUM PHOSPHATE 4 MG/ML IJ SOLN
INTRAMUSCULAR | Status: AC
  Filled 2014-08-05: qty 2

## 2014-08-05 MED ORDER — HYDROMORPHONE HCL 1 MG/ML IJ SOLN: 0.2500 mg | INTRAMUSCULAR | Status: DC | PRN

## 2014-08-05 MED ORDER — LIDOCAINE HCL (CARDIAC) 20 MG/ML IV SOLN
INTRAVENOUS | Status: DC | PRN
  Administered 2014-08-05: 60 mg via INTRAVENOUS

## 2014-08-05 MED ORDER — OXYCODONE HCL 5 MG PO TABS: 5.0000 mg | ORAL_TABLET | ORAL | Status: DC | PRN

## 2014-08-05 MED ORDER — PROPOFOL 10 MG/ML IV BOLUS
INTRAVENOUS | Status: DC | PRN
  Administered 2014-08-05: 140 mg via INTRAVENOUS

## 2014-08-05 MED ORDER — SODIUM CHLORIDE 0.9 % IJ SOLN
INTRAMUSCULAR | Status: AC
  Filled 2014-08-05: qty 10

## 2014-08-05 MED ORDER — PANTOPRAZOLE SODIUM 40 MG PO TBEC
40.0000 mg | DELAYED_RELEASE_TABLET | Freq: Every day | ORAL | Status: DC
  Filled 2014-08-05 (×2): qty 1

## 2014-08-05 MED ORDER — OXYCODONE HCL 5 MG/5ML PO SOLN: 5.0000 mg | Freq: Once | ORAL | Status: DC | PRN

## 2014-08-05 MED ORDER — MIDAZOLAM HCL 2 MG/2ML IJ SOLN
INTRAMUSCULAR | Status: AC
  Filled 2014-08-05: qty 2

## 2014-08-05 MED ORDER — ENOXAPARIN SODIUM 40 MG/0.4ML ~~LOC~~ SOLN
40.0000 mg | SUBCUTANEOUS | Status: DC
  Administered 2014-08-05: 40 mg via SUBCUTANEOUS
  Filled 2014-08-05: qty 0.4

## 2014-08-05 MED ORDER — LEVOTHYROXINE SODIUM 50 MCG PO TABS
50.0000 ug | ORAL_TABLET | Freq: Every morning | ORAL | Status: DC
  Filled 2014-08-05 (×2): qty 1

## 2014-08-05 MED ORDER — CEFAZOLIN SODIUM-DEXTROSE 2-3 GM-% IV SOLR
2.0000 g | INTRAVENOUS | Status: AC
  Administered 2014-08-05: 2 g via INTRAVENOUS
  Filled 2014-08-05: qty 50

## 2014-08-05 MED ORDER — MEPERIDINE HCL 25 MG/ML IJ SOLN: 6.2500 mg | INTRAMUSCULAR | Status: DC | PRN

## 2014-08-05 MED ORDER — ONDANSETRON HCL 4 MG/2ML IJ SOLN
INTRAMUSCULAR | Status: AC
  Filled 2014-08-05: qty 2

## 2014-08-05 MED ORDER — BUPIVACAINE-EPINEPHRINE (PF) 0.5% -1:200000 IJ SOLN
INTRAMUSCULAR | Status: DC | PRN
  Administered 2014-08-05: 20 mL via PERINEURAL

## 2014-08-05 MED ORDER — KCL IN DEXTROSE-NACL 20-5-0.45 MEQ/L-%-% IV SOLN
INTRAVENOUS | Status: DC
  Administered 2014-08-05 – 2014-08-06 (×2): via INTRAVENOUS
  Filled 2014-08-05 (×2): qty 1000

## 2014-08-05 MED ORDER — METHYLENE BLUE 1 % INJ SOLN
INTRAMUSCULAR | Status: AC
  Filled 2014-08-05: qty 10

## 2014-08-05 MED ORDER — FENTANYL CITRATE (PF) 250 MCG/5ML IJ SOLN
INTRAMUSCULAR | Status: AC
  Filled 2014-08-05: qty 5

## 2014-08-05 MED ORDER — OXYCODONE HCL 5 MG PO TABS: 5.0000 mg | ORAL_TABLET | Freq: Once | ORAL | Status: DC | PRN

## 2014-08-05 MED ORDER — ROCURONIUM BROMIDE 50 MG/5ML IV SOLN
INTRAVENOUS | Status: AC
  Filled 2014-08-05: qty 1

## 2014-08-05 MED ORDER — SODIUM CHLORIDE 0.9 % IJ SOLN
INTRAMUSCULAR | Status: DC | PRN
  Administered 2014-08-05: 08:00:00 via INTRAMUSCULAR

## 2014-08-05 MED ORDER — PHENYLEPHRINE HCL 10 MG/ML IJ SOLN
INTRAMUSCULAR | Status: DC | PRN
  Administered 2014-08-05 (×2): 80 ug via INTRAVENOUS
  Administered 2014-08-05: 40 ug via INTRAVENOUS
  Administered 2014-08-05: 80 ug via INTRAVENOUS

## 2014-08-05 MED ORDER — 0.9 % SODIUM CHLORIDE (POUR BTL) OPTIME
TOPICAL | Status: DC | PRN
  Administered 2014-08-05 (×2): 1000 mL

## 2014-08-05 MED ORDER — DEXAMETHASONE SODIUM PHOSPHATE 4 MG/ML IJ SOLN
INTRAMUSCULAR | Status: DC | PRN
  Administered 2014-08-05: 8 mg via INTRAVENOUS

## 2014-08-05 MED ORDER — EPHEDRINE SULFATE 50 MG/ML IJ SOLN
INTRAMUSCULAR | Status: DC | PRN
  Administered 2014-08-05: 10 mg via INTRAVENOUS

## 2014-08-05 MED ORDER — ALPRAZOLAM 0.5 MG PO TABS
0.5000 mg | ORAL_TABLET | Freq: Three times a day (TID) | ORAL | Status: DC | PRN
  Administered 2014-08-05: 0.5 mg via ORAL
  Filled 2014-08-05: qty 1

## 2014-08-05 MED ORDER — PROPOFOL 10 MG/ML IV BOLUS
INTRAVENOUS | Status: AC
  Filled 2014-08-05: qty 20

## 2014-08-05 MED ORDER — MIDAZOLAM HCL 5 MG/5ML IJ SOLN
INTRAMUSCULAR | Status: DC | PRN
  Administered 2014-08-05 (×2): 1 mg via INTRAVENOUS

## 2014-08-05 SURGICAL SUPPLY — 57 items
BINDER BREAST LRG (GAUZE/BANDAGES/DRESSINGS) IMPLANT
BINDER BREAST XLRG (GAUZE/BANDAGES/DRESSINGS) ×2 IMPLANT
CANISTER SUCTION 2500CC (MISCELLANEOUS) ×6 IMPLANT
CHLORAPREP W/TINT 26ML (MISCELLANEOUS) ×3 IMPLANT
CLIP TI MEDIUM 6 (CLIP) ×3 IMPLANT
CONT SPEC 4OZ CLIKSEAL STRL BL (MISCELLANEOUS) ×3 IMPLANT
COVER PROBE W GEL 5X96 (DRAPES) ×3 IMPLANT
COVER SURGICAL LIGHT HANDLE (MISCELLANEOUS) ×3 IMPLANT
DEVICE DISSECT PLASMABLAD 3.0S (MISCELLANEOUS) ×1 IMPLANT
DRAIN CHANNEL 19F RND (DRAIN) ×3 IMPLANT
DRAPE CHEST BREAST 15X10 FENES (DRAPES) ×3 IMPLANT
DRAPE UTILITY XL STRL (DRAPES) ×6 IMPLANT
DRSG PAD ABDOMINAL 8X10 ST (GAUZE/BANDAGES/DRESSINGS) ×4 IMPLANT
ELECT CAUTERY BLADE 6.4 (BLADE) ×3 IMPLANT
ELECT REM PT RETURN 9FT ADLT (ELECTROSURGICAL) ×6
ELECTRODE REM PT RTRN 9FT ADLT (ELECTROSURGICAL) ×2 IMPLANT
EVACUATOR SILICONE 100CC (DRAIN) ×3 IMPLANT
GAUZE SPONGE 4X4 12PLY STRL (GAUZE/BANDAGES/DRESSINGS) ×2 IMPLANT
GLOVE BIO SURGEON STRL SZ 6.5 (GLOVE) ×2 IMPLANT
GLOVE BIO SURGEON STRL SZ7 (GLOVE) ×2 IMPLANT
GLOVE BIO SURGEON STRL SZ7.5 (GLOVE) ×2 IMPLANT
GLOVE BIO SURGEONS STRL SZ 6.5 (GLOVE) ×2
GLOVE BIOGEL PI IND STRL 6.5 (GLOVE) IMPLANT
GLOVE BIOGEL PI IND STRL 7.0 (GLOVE) IMPLANT
GLOVE BIOGEL PI IND STRL 8 (GLOVE) ×1 IMPLANT
GLOVE BIOGEL PI INDICATOR 6.5 (GLOVE) ×2
GLOVE BIOGEL PI INDICATOR 7.0 (GLOVE) ×6
GLOVE BIOGEL PI INDICATOR 8 (GLOVE) ×2
GLOVE ECLIPSE 7.5 STRL STRAW (GLOVE) ×3 IMPLANT
GOWN STRL REUS W/ TWL LRG LVL3 (GOWN DISPOSABLE) ×2 IMPLANT
GOWN STRL REUS W/ TWL XL LVL3 (GOWN DISPOSABLE) ×1 IMPLANT
GOWN STRL REUS W/TWL LRG LVL3 (GOWN DISPOSABLE) ×9
GOWN STRL REUS W/TWL XL LVL3 (GOWN DISPOSABLE) ×3
KIT BASIN OR (CUSTOM PROCEDURE TRAY) ×3 IMPLANT
KIT ROOM TURNOVER OR (KITS) ×3 IMPLANT
LIQUID BAND (GAUZE/BANDAGES/DRESSINGS) ×5 IMPLANT
NDL 18GX1X1/2 (RX/OR ONLY) (NEEDLE) ×1 IMPLANT
NDL HYPO 25GX1X1/2 BEV (NEEDLE) ×1 IMPLANT
NEEDLE 18GX1X1/2 (RX/OR ONLY) (NEEDLE) ×3 IMPLANT
NEEDLE HYPO 25GX1X1/2 BEV (NEEDLE) ×3 IMPLANT
NS IRRIG 1000ML POUR BTL (IV SOLUTION) ×3 IMPLANT
PACK GENERAL/GYN (CUSTOM PROCEDURE TRAY) ×3 IMPLANT
PAD ARMBOARD 7.5X6 YLW CONV (MISCELLANEOUS) ×3 IMPLANT
PLASMABLADE 3.0S (MISCELLANEOUS) ×3
SPECIMEN JAR X LARGE (MISCELLANEOUS) ×3 IMPLANT
SPONGE LAP 18X18 X RAY DECT (DISPOSABLE) ×2 IMPLANT
SUT ETHILON 2 0 FS 18 (SUTURE) ×3 IMPLANT
SUT MNCRL AB 4-0 PS2 18 (SUTURE) ×5 IMPLANT
SUT VIC AB 3-0 54X BRD REEL (SUTURE) ×1 IMPLANT
SUT VIC AB 3-0 BRD 54 (SUTURE) ×3
SUT VIC AB 3-0 SH 18 (SUTURE) ×5 IMPLANT
SYR CONTROL 10ML LL (SYRINGE) ×3 IMPLANT
TAPE STRIPS DRAPE STRL (GAUZE/BANDAGES/DRESSINGS) ×3 IMPLANT
TOWEL OR 17X24 6PK STRL BLUE (TOWEL DISPOSABLE) ×3 IMPLANT
TOWEL OR 17X26 10 PK STRL BLUE (TOWEL DISPOSABLE) ×3 IMPLANT
TUBE CONNECTING 12'X1/4 (SUCTIONS) ×1
TUBE CONNECTING 12X1/4 (SUCTIONS) ×2 IMPLANT

## 2014-08-05 NOTE — Anesthesia Postprocedure Evaluation (Signed)
  Anesthesia Post-op Note  Patient: [name]  Procedure(s) Performed: Procedure(s): RIGHT MASTECTOMY WITH SENTINEL LYMPH NODE BIOPSIES (Right)  Patient Location: PACU  Anesthesia Type: General, Regional   Level of Consciousness: awake, alert  and oriented  Airway and Oxygen Therapy: Patient Spontanous Breathing  Post-op Pain: mild  Post-op Assessment: Post-op Vital signs reviewed  Post-op Vital Signs: Reviewed  Last Vitals:  Filed Vitals:   08/05/14 0930  BP: 126/76  Pulse: 71  Temp:   Resp: 13    Complications: No apparent anesthesia complications

## 2014-08-05 NOTE — Op Note (Signed)
Preoperative Diagnosis: right breast cancer  Postoprative Diagnosis: right breast cancer  Procedure: Procedure(s): RIGHT MASTECTOMY WITH SENTINEL LYMPH NODE BIOPSIES   Surgeon: Excell Seltzer T   Assistants: None  Anesthesia:  General LMA anesthesia  Indications: Patient is a 80 year old female with multifocal cancer of the right breast. After extensive preoperative workup and discussion detailed elsewhere we have elected to proceed with right total mastectomy and right axillary sentinel lymph node biopsy as her initial surgical therapy. The nature of the surgery and recovery, indications and risks have been discussed extensively and detailed elsewhere.    Procedure Detail:  Preoperatively the patient underwent injection of 1 mCi of technetium sulfur colloid intradermally around the right nipple and also underwent pectoral axillary block by anesthesia. She is brought to the operating room, placed supine position on operating table. Laryngeal mask general anesthesia induced. She was carefully positioned with her arm extended on arm board and the entire right breast, chest, axilla and upper arm were widely sterilely prepped and draped. Just prior to this under sterile technique after patient timeout I injected 5 mL of dilute methylene blue simultaneously beneath the right nipple and massaged this for several minutes. A slightly obliquely oriented elliptical incision was planned encompassing the nipple areolar complex. The incision was made with the plasma blade. Skin and subcutaneous flaps were then raised superior to the clavicle, medially to the edge of the sternum, inferiorly to the inframammary crease and laterally out to the anterior border of the latissimus dorsi which was dissected free. A short skin and subcutis flap was raised over the axilla and the axilla exposed. Using the neoprobe for guidance the clavipectoral fascia was incised and I dissected down onto a small normal-appearing  noted with elevated counts in the range of 350. This was sent as hot non-blue axillary sentinel lymph node #1. A second lymph node was located in the axilla also very small with counts of about 100 which was set as hot right axillary sentinel lymph node #2. There was no blue dye in the axilla. No further elevated counts and no palpable lymph nodes. Following this the breast tissue was reflected up off the chest wall working medial to laterally with the plasma blade. It was freed from the pectoralis major and serratus laterally and the pectoralis major border dissected free. The specimen was dissected up off of the latissimus inferiorly and dissected and isolated up to the junction with the low axilla which point I came across a specimen with Kelly clamps and it was removed and oriented and sent for permanent pathology. Clamps were tied with 3-0 Vicryl. The wound was thoroughly irrigated and complete hemostasis assured. A 19 Blake closed suction drain was left through a separate stab wound and placed under both flaps. This obtained this tissue was closed with interrupted 3-0 Vicryl and the skin with subcuticular 4-0 Monocryl and Dermabond. Sponge needle and instrument counts were correct.    Findings: As above  Estimated Blood Loss:  less than 50 mL         Drains: 19 Blake drain beneath the flaps  Blood Given: none          Specimens: #1 right total mastectomy   #2.Hot  Right axillary sentinel lymph node      #3 hot right axillary sentinel lymph node        Complications:  * No complications entered in OR log *         Disposition: PACU - hemodynamically stable.  Condition: stable

## 2014-08-05 NOTE — Interval H&P Note (Signed)
History and Physical Interval Note:  08/05/2014 7:01 AM  [name]  has presented today for surgery, with the diagnosis of right breast cancer  The various methods of treatment have been discussed with the patient and family. After consideration of risks, benefits and other options for treatment, the patient has consented to  Procedure(s): RIGHT MASTECTOMY WITH SENTINEL LYMPH NODE BIOPSY (Right) as a surgical intervention .  The patient's history has been reviewed, patient examined, no change in status, stable for surgery.  I have reviewed the patient's chart and labs.  Questions were answered to the patient's satisfaction.     Venson Ferencz T

## 2014-08-05 NOTE — Transfer of Care (Signed)
Immediate Anesthesia Transfer of Care Note  Patient: [name]  Procedure(s) Performed: Procedure(s): RIGHT MASTECTOMY WITH SENTINEL LYMPH NODE BIOPSIES (Right)  Patient Location: PACU  Anesthesia Type:General  Level of Consciousness: awake, alert , oriented and patient cooperative  Airway & Oxygen Therapy: Patient Spontanous Breathing and Patient connected to nasal cannula oxygen  Post-op Assessment: Report given to RN and Post -op Vital signs reviewed and stable  Post vital signs: Reviewed and stable  Last Vitals:  Filed Vitals:   08/05/14 0547  BP: 149/97  Pulse: 77  Temp: 36.6 C  Resp: 18    Complications: No apparent anesthesia complications

## 2014-08-05 NOTE — Anesthesia Procedure Notes (Addendum)
Anesthesia Regional Block:  Pectoralis block  Pre-Anesthetic Checklist: ,, timeout performed, Correct Patient, Correct Site, Correct Laterality, Correct Procedure, Correct Position, site marked, Risks and benefits discussed,  Surgical consent,  Pre-op evaluation,  At surgeon's request and post-op pain management  Laterality: Right and Upper  Prep: chloraprep       Needles:  Injection technique: Single-shot  Needle Type: Echogenic Needle     Needle Length: 9cm 9 cm Needle Gauge: 21 and 21 G    Additional Needles:  Procedures: ultrasound guided (picture in chart) Pectoralis block Narrative:  Start time: 08/05/2014 6:56 AM End time: 08/05/2014 7:03 AM Injection made incrementally with aspirations every 5 mL.  Performed by: Personally  Anesthesiologist: Chantay Whitelock

## 2014-08-05 NOTE — Anesthesia Preprocedure Evaluation (Signed)
Anesthesia Evaluation  Patient identified by MRN, date of birth, ID band Patient awake    Reviewed: Allergy & Precautions, NPO status , Patient's Chart, lab work & pertinent test results  Airway Mallampati: I  TM Distance: >3 FB Neck ROM: Full    Dental  (+) Teeth Intact, Dental Advisory Given   Pulmonary  breath sounds clear to auscultation        Cardiovascular hypertension, Pt. on medications Rhythm:Regular Rate:Normal     Neuro/Psych    GI/Hepatic hiatal hernia,   Endo/Other  Hypothyroidism   Renal/GU      Musculoskeletal   Abdominal   Peds  Hematology   Anesthesia Other Findings Hx of BPPV treated with movement therapy  Reproductive/Obstetrics                             Anesthesia Physical Anesthesia Plan  ASA: II  Anesthesia Plan: General and Regional   Post-op Pain Management:    Induction: Intravenous  Airway Management Planned: LMA  Additional Equipment:   Intra-op Plan:   Post-operative Plan: Extubation in OR  Informed Consent: I have reviewed the patients History and Physical, chart, labs and discussed the procedure including the risks, benefits and alternatives for the proposed anesthesia with the patient or authorized representative who has indicated his/her understanding and acceptance.   Dental advisory given  Plan Discussed with: CRNA, Anesthesiologist and Surgeon  Anesthesia Plan Comments:         Anesthesia Quick Evaluation

## 2014-08-06 ENCOUNTER — Encounter (HOSPITAL_COMMUNITY): Payer: Self-pay | Admitting: General Surgery

## 2014-08-06 DIAGNOSIS — Z803 Family history of malignant neoplasm of breast: Secondary | ICD-10-CM | POA: Diagnosis not present

## 2014-08-06 DIAGNOSIS — I10 Essential (primary) hypertension: Secondary | ICD-10-CM | POA: Diagnosis not present

## 2014-08-06 DIAGNOSIS — K449 Diaphragmatic hernia without obstruction or gangrene: Secondary | ICD-10-CM | POA: Diagnosis not present

## 2014-08-06 DIAGNOSIS — C50811 Malignant neoplasm of overlapping sites of right female breast: Secondary | ICD-10-CM | POA: Diagnosis not present

## 2014-08-06 DIAGNOSIS — E039 Hypothyroidism, unspecified: Secondary | ICD-10-CM | POA: Diagnosis not present

## 2014-08-06 DIAGNOSIS — Z91041 Radiographic dye allergy status: Secondary | ICD-10-CM | POA: Diagnosis not present

## 2014-08-06 DIAGNOSIS — Z9011 Acquired absence of right breast and nipple: Secondary | ICD-10-CM | POA: Diagnosis not present

## 2014-08-06 DIAGNOSIS — Z170 Estrogen receptor positive status [ER+]: Secondary | ICD-10-CM | POA: Diagnosis not present

## 2014-08-06 DIAGNOSIS — Z882 Allergy status to sulfonamides status: Secondary | ICD-10-CM | POA: Diagnosis not present

## 2014-08-06 LAB — CBC
HCT: 35.2 % — ABNORMAL LOW (ref 36.0–46.0)
Hemoglobin: 11.7 g/dL — ABNORMAL LOW (ref 12.0–15.0)
MCH: 30.4 pg (ref 26.0–34.0)
MCHC: 33.2 g/dL (ref 30.0–36.0)
MCV: 91.4 fL (ref 78.0–100.0)
PLATELETS: 177 10*3/uL (ref 150–400)
RBC: 3.85 MIL/uL — ABNORMAL LOW (ref 3.87–5.11)
RDW: 12.9 % (ref 11.5–15.5)
WBC: 11.1 10*3/uL — ABNORMAL HIGH (ref 4.0–10.5)

## 2014-08-06 MED ORDER — HYDROCODONE-ACETAMINOPHEN 5-325 MG PO TABS: 1.0000 | ORAL_TABLET | ORAL | Status: DC | PRN

## 2014-08-06 NOTE — Discharge Summary (Signed)
   Patient ID: [name] [id number] 80 y.o. 1935-01-23  08/05/2014  Discharge date and time: 08/06/2014   Admitting Physician: Excell Seltzer T  Discharge Physician: Excell Seltzer T  Admission Diagnoses: right breast cancer  Discharge Diagnoses: Same  Operations: Procedure(s): RIGHT MASTECTOMY WITH SENTINEL LYMPH NODE BIOPSIES  Admission Condition: good  Discharged Condition: good  Indication for Admission: Patient has a recent diagnosis of multicentric cancer of the right breast. She is electively admitted for right total mastectomy with sentinel lymph node biopsy.  Hospital Course: On the morning of admission the patient underwent an uneventful right total mastectomy with axillary sentinel lymph node biopsy. The following morning she is very comfortable. No complaints. Wound is clean and dry. Appropriate serosanguineous drainage and JP drain. Felt ready for discharge.   Disposition: Home  Patient Instructions:    Medication List    TAKE these medications        acetaminophen 500 MG tablet  Commonly known as:  TYLENOL  Take 500 mg by mouth every 6 (six) hours as needed for mild pain or headache.     ALPRAZolam 0.5 MG tablet  Commonly known as:  XANAX  Take 0.5 mg by mouth 3 (three) times daily as needed for sleep or anxiety.     BENICAR 40 MG tablet  Generic drug:  olmesartan  Take 20 mg by mouth daily.     cefUROXime 500 MG tablet  Commonly known as:  CEFTIN  Take 1 tablet (500 mg total) by mouth 2 (two) times daily with a meal.     HYDROcodone-acetaminophen 5-325 MG per tablet  Commonly known as:  NORCO/VICODIN  Take 1-2 tablets by mouth every 4 (four) hours as needed for moderate pain or severe pain.     levothyroxine 50 MCG tablet  Commonly known as:  SYNTHROID, LEVOTHROID  Take 50 mcg by mouth every morning.     meclizine 12.5 MG tablet  Commonly known as:  ANTIVERT  Take 1 tablet (12.5 mg total) by mouth 3 (three) times daily.     metoprolol succinate 100 MG 24 hr tablet  Commonly known as:  TOPROL-XL  Take 100 mg by mouth daily. Take with or immediately following a meal.     multivitamin with minerals Tabs tablet  Take 1 tablet by mouth daily.     omeprazole 20 MG capsule  Commonly known as:  PRILOSEC  Take 20 mg by mouth daily.        Activity: activity as tolerated Diet: regular diet Wound Care: empty JP drain twice daily and record amount  Follow-up:  With Dr. Excell Seltzer in 1 week.  Signed: Edward Jolly MD, FACS  08/06/2014, 8:13 AM

## 2014-08-06 NOTE — Progress Notes (Signed)
Verbally understood DC instructions, no questions asked, instructed on drain care, supplies sent home with pt.Marland Kitchen

## 2014-08-06 NOTE — Progress Notes (Signed)
Patient ID: [name], female   DOB: [birth year], 80 y.o.   MRN: [id number] 1 Day Post-Op  Subjective: Reports she is doing "great". Has not had any pain or required medications. No other complaints.  Objective: Vital signs in last 24 hours: Temp:  [97.4 F (36.3 C)-98.6 F (37 C)] 98.6 F (37 C) (06/15 0529) Pulse Rate:  [69-85] 85 (06/15 0529) Resp:  [11-18] 17 (06/15 0529) BP: (114-133)/(50-76) 125/59 mmHg (06/15 0529) SpO2:  [93 %-100 %] 94 % (06/15 0529) Last BM Date: 08/04/14  Intake/Output from previous day: 06/14 0701 - 06/15 0700 In: 3475 [P.O.:720; I.V.:2755] Out: 225 [Drains:150; Blood:75] Intake/Output this shift:    General appearance: alert, cooperative and no distress Incision/Wound: clean and dry without swelling or drainage. JP drainage serosanguineous.  Lab Results:   Recent Labs  08/06/14 0507  WBC 11.1*  HGB 11.7*  HCT 35.2*  PLT 177   BMET No results for input(s): NA, K, CL, CO2, GLUCOSE, BUN, CREATININE, CALCIUM in the last 72 hours.   Studies/Results: Nm Sentinel Node Inj-no Rpt (breast)  08/05/2014   CLINICAL DATA: Cancer right breast   Sulfur colloid was injected intradermally by the nuclear medicine  technologist for breast cancer sentinel node localization.     Anti-infectives: Anti-infectives    Start     Dose/Rate Route Frequency Ordered Stop   08/05/14 0539  ceFAZolin (ANCEF) IVPB 2 g/50 mL premix     2 g 100 mL/hr over 30 Minutes Intravenous On call to O.R. 08/05/14 0539 08/05/14 0726      Assessment/Plan: s/p Procedure(s): RIGHT MASTECTOMY WITH SENTINEL LYMPH NODE BIOPSIES Doing very well without apparent complication. Okay for discharge.      Isack Lavalley T 08/06/2014

## 2014-08-06 NOTE — Discharge Instructions (Signed)
[facility] °[phone] ° °MASTECTOMY: POST OP INSTRUCTIONS ° °Always review your discharge instruction sheet given to you by the facility where your surgery was performed. °IF YOU HAVE DISABILITY OR FAMILY LEAVE FORMS, YOU MUST BRING THEM TO THE OFFICE FOR PROCESSING.   °DO NOT GIVE THEM TO YOUR DOCTOR. °A prescription for pain medication may be given to you upon discharge.  Take your pain medication as prescribed, if needed.  If narcotic pain medicine is not needed, then you may take acetaminophen (Tylenol) or ibuprofen (Advil) as needed. °1. Take your usually prescribed medications unless otherwise directed. °2. If you need a refill on your pain medication, please contact your pharmacy.  They will contact our office to request authorization.  Prescriptions will not be filled after 5pm or on week-ends. °3. You should follow a light diet the first few days after arrival home, such as soup and crackers, etc.  Resume your normal diet the day after surgery. °4. Most patients will experience some swelling and bruising on the chest and underarm.  Ice packs will help.  Swelling and bruising can take several days to resolve.  °5. It is common to experience some constipation if taking pain medication after surgery.  Increasing fluid intake and taking a stool softener (such as Colace) will usually help or prevent this problem from occurring.  A mild laxative (Milk of Magnesia or Miralax) should be taken according to package instructions if there are no bowel movements after 48 hours. °6. Unless discharge instructions indicate otherwise, leave your bandage dry and in place until your next appointment in 3-5 days.  You may take a limited sponge bath.  No tube baths or showers until the drains are removed.  You may have steri-strips (small skin tapes) in place directly over the incision.  These strips should be left on the skin for 7-10 days.  If your surgeon used skin glue on the incision, you may  shower in 24 hours.  The glue will flake off over the next 2-3 weeks.  Any sutures or staples will be removed at the office during your follow-up visit. °7. DRAINS:  If you have drains in place, it is important to keep a list of the amount of drainage produced each day in your drains.  Before leaving the hospital, you should be instructed on drain care.  Call our office if you have any questions about your drains. °8. ACTIVITIES:  You may resume regular (light) daily activities beginning the next day--such as daily self-care, walking, climbing stairs--gradually increasing activities as tolerated.  You may have sexual intercourse when it is comfortable.  Refrain from any heavy lifting or straining until approved by your doctor. °a. You may drive when you are no longer taking prescription pain medication, you can comfortably wear a seatbelt, and you can safely maneuver your car and apply brakes. °b. RETURN TO WORK:  __________________________________________________________ °9. You should see your doctor in the office for a follow-up appointment approximately 3-5 days after your surgery.  Your doctor’s nurse will typically make your follow-up appointment when she calls you with your pathology report.  Expect your pathology report 2-3 business days after your surgery.  You may call to check if you do not hear from us after three days.   °10. OTHER INSTRUCTIONS: ______________________________________________________________________________________________ ____________________________________________________________________________________________ °WHEN TO CALL YOUR DOCTOR: °1. Fever over 101.0 °2. Nausea and/or vomiting °3. Extreme swelling or bruising °4. Continued bleeding from incision. °5. Increased pain, redness, or drainage from the incision. °  The clinic staff is available to answer your questions during regular business hours.  Please don’t hesitate to call and ask to speak to one of the nurses for clinical  concerns.  If you have a medical emergency, go to the nearest emergency room or call 911.  A surgeon from [facility] is always on call at the hospital. °[address], [address], [place], [place]  [address] ? [address] [address], [place], [place]   [address] °[phone] ? [phone] ? FAX [phone] °Web site: [email or web address] °

## 2014-08-17 NOTE — Assessment & Plan Note (Signed)
Rt Mastectomy 08/05/14:  IDC Grade 2, 1.2 cm T1cN0 (stage 1A); ER 100%, PR 86%, Ki-67 15%, HER-2 negative ratio 1.13 ILC Grade 2, 2.4 cm T2N0 (stage 2A); 0/4 LN Neg; ER 99%, PR 30%, Ki-67 5%  Pathology review: Discussed the path report and provided her with a copy of it  Recommendation: 1. Anti-estrogen therapy with anastrozole 1 mg daily. Anastrozole counseling: We discussed the risks and benefits of anti-estrogen therapy with aromatase inhibitors. These include but not limited to insomnia, hot flashes, mood changes, vaginal dryness, bone density loss, and weight gain. Although rare, serious side effects including endometrial cancer, risk of blood clots were also discussed. We strongly believe that the benefits far outweigh the risks. Patient understands these risks and consented to starting treatment. Planned treatment duration is 5 years.  RTC in 3 months

## 2014-08-18 ENCOUNTER — Ambulatory Visit (HOSPITAL_BASED_OUTPATIENT_CLINIC_OR_DEPARTMENT_OTHER): Payer: Commercial Managed Care - HMO | Admitting: Hematology and Oncology

## 2014-08-18 ENCOUNTER — Other Ambulatory Visit: Payer: Self-pay

## 2014-08-18 ENCOUNTER — Encounter: Payer: Self-pay | Admitting: Hematology and Oncology

## 2014-08-18 ENCOUNTER — Telehealth: Payer: Self-pay | Admitting: Hematology and Oncology

## 2014-08-18 VITALS — BP 130/60 | HR 77 | Temp 97.0°F | Resp 18 | Ht 60.0 in | Wt 180.5 lb

## 2014-08-18 DIAGNOSIS — C50911 Malignant neoplasm of unspecified site of right female breast: Secondary | ICD-10-CM

## 2014-08-18 DIAGNOSIS — C50511 Malignant neoplasm of lower-outer quadrant of right female breast: Secondary | ICD-10-CM

## 2014-08-18 DIAGNOSIS — C50811 Malignant neoplasm of overlapping sites of right female breast: Secondary | ICD-10-CM

## 2014-08-18 MED ORDER — ANASTROZOLE 1 MG PO TABS: 1.0000 mg | ORAL_TABLET | Freq: Every day | ORAL | Status: DC

## 2014-08-18 MED ORDER — CALCIUM CARBONATE-VITAMIN D 500-200 MG-UNIT PO TABS: 1.0000 | ORAL_TABLET | Freq: Every day | ORAL | Status: DC

## 2014-08-18 NOTE — Progress Notes (Signed)
Patient Care Team: Prince Solian, MD as PCP - General (Internal Medicine)  DIAGNOSIS: No matching staging information was found for the patient.  SUMMARY OF ONCOLOGIC HISTORY:   Breast cancer, right breast   05/21/2014 Initial Biopsy Right breast 7:00: Invasive lobular cancer ER 99%, PR 30%, Ki-67 5% HER-2 negative ratio 1.32, with LCIS; 6:00: Invasive ductal carcinoma ER 100%, PR 86%, Ki-67 15%, HER-2 negative ratio 1.13   06/02/2014 Initial Diagnosis Right breast subareolar biopsy: LCIS   08/05/2014 Surgery Rt Mastectomy: IDC Grade 2, 1.2 cm T1cN0 (stage 1A); ILC Grade 2, 2.4 cm T2N0 (stage 2A); 0/4 LN Neg; ER/PR Pos, Her 2 Neg   08/18/2014 -  Anti-estrogen oral therapy Anastrozole 1 mg daily    CHIEF COMPLIANT: Follow-up after right mastectomy  INTERVAL HISTORY: [name] is a 80 year old with above-mentioned history of right breast cancer treated with Mastectomy. She had 2 kinds of breast cancers ductal kind and lobular kind both of which were ER/PR positive and HER-2 negative. The ductal kind of stage IA of the lobular is stage II a. She is still recovering from the surgery and has postoperative drains.  REVIEW OF SYSTEMS:   Constitutional: Denies fevers, chills or abnormal weight loss Eyes: Denies blurriness of vision Ears, nose, mouth, throat, and face: Denies mucositis or sore throat Respiratory: Denies cough, dyspnea or wheezes Cardiovascular: Denies palpitation, chest discomfort or lower extremity swelling Gastrointestinal:  Denies nausea, heartburn or change in bowel habits Skin: Denies abnormal skin rashes Lymphatics: Denies new lymphadenopathy or easy bruising Neurological:Denies numbness, tingling or new weaknesses Behavioral/Psych: Mood is stable, no new changes  Breast: Recovering from breast surgery All other systems were reviewed with the patient and are negative.  I have reviewed the past medical history, past surgical history, social history and family  history with the patient and they are unchanged from previous note.  ALLERGIES:  is allergic to iodine and sulfa antibiotics.  MEDICATIONS:  Current Outpatient Prescriptions  Medication Sig Dispense Refill  . acetaminophen (TYLENOL) 500 MG tablet Take 500 mg by mouth every 6 (six) hours as needed for mild pain or headache.    . ALPRAZolam (XANAX) 0.5 MG tablet Take 0.5 mg by mouth 3 (three) times daily as needed for sleep or anxiety.    Marland Kitchen BENICAR 40 MG tablet Take 20 mg by mouth daily.     . cefUROXime (CEFTIN) 500 MG tablet Take 1 tablet (500 mg total) by mouth 2 (two) times daily with a meal. 8 tablet 0  . HYDROcodone-acetaminophen (NORCO/VICODIN) 5-325 MG per tablet Take 1-2 tablets by mouth every 4 (four) hours as needed for moderate pain or severe pain. 30 tablet 0  . levothyroxine (SYNTHROID, LEVOTHROID) 50 MCG tablet Take 50 mcg by mouth every morning.    . meclizine (ANTIVERT) 12.5 MG tablet Take 1 tablet (12.5 mg total) by mouth 3 (three) times daily. 20 tablet 0  . metoprolol succinate (TOPROL-XL) 100 MG 24 hr tablet Take 100 mg by mouth daily. Take with or immediately following a meal.    . Multiple Vitamin (MULTIVITAMIN WITH MINERALS) TABS Take 1 tablet by mouth daily.    Marland Kitchen omeprazole (PRILOSEC) 20 MG capsule Take 20 mg by mouth daily.     No current facility-administered medications for this visit.    PHYSICAL EXAMINATION: ECOG PERFORMANCE STATUS: 1 - Symptomatic but completely ambulatory GENERAL:alert, no distress and comfortable SKIN: skin color, texture, turgor are normal, no rashes or significant lesions EYES: normal, Conjunctiva are pink and non-injected,  sclera clear OROPHARYNX:no exudate, no erythema and lips, buccal mucosa, and tongue normal  NECK: supple, thyroid normal size, non-tender, without nodularity LYMPH:  no palpable lymphadenopathy in the cervical, axillary or inguinal LUNGS: clear to auscultation and percussion with normal breathing effort HEART: regular  rate & rhythm and no murmurs and no lower extremity edema ABDOMEN:abdomen soft, non-tender and normal bowel sounds Musculoskeletal:no cyanosis of digits and no clubbing  NEURO: alert & oriented x 3 with fluent speech, no focal motor/sensory deficits  LABORATORY DATA:  I have reviewed the data as listed   Chemistry      Component Value Date/Time   NA 138 07/31/2014 0557   K 4.8 07/31/2014 0557   CL 105 07/31/2014 0557   CO2 23 07/31/2014 0557   BUN 15 07/31/2014 0557   CREATININE 0.80 07/31/2014 0557      Component Value Date/Time   CALCIUM 8.8* 07/31/2014 0557   ALKPHOS 56 07/31/2014 0557   AST 20 07/31/2014 0557   ALT 18 07/31/2014 0557   BILITOT 0.4 07/31/2014 0557       Lab Results  Component Value Date   WBC 11.1* 08/06/2014   HGB 11.7* 08/06/2014   HCT 35.2* 08/06/2014   MCV 91.4 08/06/2014   PLT 177 08/06/2014   NEUTROABS 5.2 07/30/2014    ASSESSMENT & PLAN:  Breast cancer, right breast Rt Mastectomy 08/05/14:  IDC Grade 2, 1.2 cm T1cN0 (stage 1A); ER 100%, PR 86%, Ki-67 15%, HER-2 negative ratio 1.13 ILC Grade 2, 2.4 cm T2N0 (stage 2A); 0/4 LN Neg; ER 99%, PR 30%, Ki-67 5%  Pathology review: Discussed the path report and provided her with a copy of it  Recommendation: 1. Anti-estrogen therapy with anastrozole 1 mg daily. (Started approximately July 15) (I did not recommend Oncotype DX testing because of her elderly age and low Ki-67 score)   Anastrozole counseling: We discussed the risks and benefits of anti-estrogen therapy with aromatase inhibitors. These include but not limited to insomnia, hot flashes, mood changes, vaginal dryness, bone density loss, and weight gain. Although rare, serious side effects including endometrial cancer, risk of blood clots were also discussed. We strongly believe that the benefits far outweigh the risks. Patient understands these risks and consented to starting treatment. Planned treatment duration is 5 years.  RTC in 3  months  No orders of the defined types were placed in this encounter.   The patient has a good understanding of the overall plan. she agrees with it. she will call with any problems that may develop before the next visit here.   Rulon Eisenmenger, MD

## 2014-08-18 NOTE — Telephone Encounter (Signed)
Gave avs & calendar for September °

## 2014-09-05 ENCOUNTER — Telehealth: Payer: Self-pay | Admitting: Adult Health

## 2014-09-05 NOTE — Telephone Encounter (Signed)
Chestine Spore, NP spoke with [name] and the patient reports that she is doing well. She began the anastrazole today and knows that she will see Dr. Lindi Adie in 3 months for follow-up.  She was offered a visit in the [facility], but currently has no complaints and would prefer to have her survivorship care plan mailed to her in lieu of an in-person visit.   Therefore, we will mail a copy of her care plan to her home per her request.  She was encouraged to call us with any questions or concerns and we would be happy to see her at any time in the future, if needed.   Mike Craze, NP [place] [phone]

## 2014-09-24 ENCOUNTER — Telehealth: Payer: Self-pay

## 2014-09-24 DIAGNOSIS — C50919 Malignant neoplasm of unspecified site of unspecified female breast: Secondary | ICD-10-CM | POA: Diagnosis not present

## 2014-09-24 NOTE — Telephone Encounter (Signed)
Order faxed to [facility] [employer] [facility].  Sent to scan. 

## 2014-09-29 DIAGNOSIS — C50919 Malignant neoplasm of unspecified site of unspecified female breast: Secondary | ICD-10-CM | POA: Diagnosis not present

## 2014-10-03 ENCOUNTER — Telehealth: Payer: Self-pay

## 2014-10-03 NOTE — Telephone Encounter (Signed)
Order for bras faxed to [employer] [employer] [facility].  Sent to scan.  

## 2014-10-10 DIAGNOSIS — R7301 Impaired fasting glucose: Secondary | ICD-10-CM | POA: Diagnosis not present

## 2014-10-10 DIAGNOSIS — F419 Anxiety disorder, unspecified: Secondary | ICD-10-CM | POA: Diagnosis not present

## 2014-10-10 DIAGNOSIS — E039 Hypothyroidism, unspecified: Secondary | ICD-10-CM | POA: Diagnosis not present

## 2014-10-10 DIAGNOSIS — C50911 Malignant neoplasm of unspecified site of right female breast: Secondary | ICD-10-CM | POA: Diagnosis not present

## 2014-10-10 DIAGNOSIS — I10 Essential (primary) hypertension: Secondary | ICD-10-CM | POA: Diagnosis not present

## 2014-10-10 DIAGNOSIS — C50919 Malignant neoplasm of unspecified site of unspecified female breast: Secondary | ICD-10-CM | POA: Diagnosis not present

## 2014-10-10 DIAGNOSIS — K219 Gastro-esophageal reflux disease without esophagitis: Secondary | ICD-10-CM | POA: Diagnosis not present

## 2014-10-10 DIAGNOSIS — R42 Dizziness and giddiness: Secondary | ICD-10-CM | POA: Diagnosis not present

## 2014-10-10 DIAGNOSIS — I2510 Atherosclerotic heart disease of native coronary artery without angina pectoris: Secondary | ICD-10-CM | POA: Diagnosis not present

## 2014-10-28 ENCOUNTER — Other Ambulatory Visit: Payer: Self-pay | Admitting: Hematology and Oncology

## 2014-10-28 NOTE — Telephone Encounter (Signed)
Last ov 08/18/14.  Next ov 11/18/14.  Chart reviewed.

## 2014-10-31 ENCOUNTER — Encounter: Payer: Self-pay | Admitting: Nurse Practitioner

## 2014-10-31 NOTE — Progress Notes (Signed)
The Survivorship Care Plan was mailed to [name] as she reported not being able to come in to the [facility]orship Clinic for an in-person visit at this time. A letter was mailed to her outlining the purpose of the content of the care plan, as well as encouraging her to reach out to me with any questions or concerns.  My business card was included in the correspondence to the patient as well.  A copy of the care plan was also routed/faxed/mailed to Tivis Ringer, MD, the patient's PCP.  I will not be placing any follow-up appointments to the Survivorship Clinic for [name], but I am happy to see her at any time in the future for any survivorship concerns that may arise. Thank you for allowing me to participate in her care!  Kenn File, [place] [phone]

## 2014-11-12 ENCOUNTER — Other Ambulatory Visit: Payer: Self-pay

## 2014-11-18 ENCOUNTER — Ambulatory Visit: Payer: Commercial Managed Care - HMO | Admitting: Hematology and Oncology

## 2014-11-18 DIAGNOSIS — H8111 Benign paroxysmal vertigo, right ear: Secondary | ICD-10-CM | POA: Diagnosis not present

## 2014-11-22 DIAGNOSIS — Z23 Encounter for immunization: Secondary | ICD-10-CM | POA: Diagnosis not present

## 2014-11-25 ENCOUNTER — Encounter: Payer: Self-pay | Admitting: Hematology and Oncology

## 2014-11-25 ENCOUNTER — Telehealth: Payer: Self-pay | Admitting: Hematology and Oncology

## 2014-11-25 ENCOUNTER — Ambulatory Visit (HOSPITAL_BASED_OUTPATIENT_CLINIC_OR_DEPARTMENT_OTHER): Payer: Commercial Managed Care - HMO | Admitting: Hematology and Oncology

## 2014-11-25 VITALS — BP 137/74 | HR 97 | Temp 98.2°F | Resp 18 | Ht 60.0 in | Wt 181.7 lb

## 2014-11-25 DIAGNOSIS — L659 Nonscarring hair loss, unspecified: Secondary | ICD-10-CM

## 2014-11-25 DIAGNOSIS — G4700 Insomnia, unspecified: Secondary | ICD-10-CM

## 2014-11-25 DIAGNOSIS — N951 Menopausal and female climacteric states: Secondary | ICD-10-CM | POA: Diagnosis not present

## 2014-11-25 DIAGNOSIS — C50811 Malignant neoplasm of overlapping sites of right female breast: Secondary | ICD-10-CM | POA: Diagnosis not present

## 2014-11-25 DIAGNOSIS — M791 Myalgia: Secondary | ICD-10-CM | POA: Diagnosis not present

## 2014-11-25 DIAGNOSIS — C50511 Malignant neoplasm of lower-outer quadrant of right female breast: Secondary | ICD-10-CM | POA: Diagnosis not present

## 2014-11-25 NOTE — Assessment & Plan Note (Signed)
Rt Mastectomy 08/05/14:  IDC Grade 2, 1.2 cm T1cN0 (stage 1A); ER 100%, PR 86%, Ki-67 15%, HER-2 negative ratio 1.13 ILC Grade 2, 2.4 cm T2N0 (stage 2A); 0/4 LN Neg; ER 99%, PR 30%, Ki-67 5%  Current treatment: Anti-estrogen therapy with anastrozole 1 mg daily. (Started approximately July 15) (I did not recommend Oncotype DX testing because of her elderly age and low Ki-67 score)  Anastrozole toxicities:  Breast cancer surveillance: 1. Left breast mammograms annually 2. Breast exams every 4-6 months  Return to clinic in 6 months for follow-up

## 2014-11-25 NOTE — Progress Notes (Signed)
Patient Care Team: Prince Solian, MD as PCP - General (Internal Medicine) Excell Seltzer, MD as Consulting Physician (General Surgery) Nicholas Lose, MD as Consulting Physician (Hematology and Oncology) Sylvan Cheese, NP as Nurse Practitioner (Nurse Practitioner)  DIAGNOSIS: Breast cancer of lower-outer quadrant of right female breast [facility])   Staging form: Breast, AJCC 7th Edition     Pathologic stage from 08/05/2014: Stage IA (T1c, N0, cM0) - Unsigned     Pathologic stage from 08/05/2014: Stage IIA (T2, N0, cM0) - Unsigned     Clinical: Stage IA (T1c, N0, M0) - Unsigned     Clinical: Stage IIA (T2, N0, M0) - Unsigned   SUMMARY OF ONCOLOGIC HISTORY:   Breast cancer of lower-outer quadrant of right female breast ([place])   04/30/2014 Mammogram Right breast: 4 x 3 x 71mm oval nodule at 5:30-6:00, 5 cm from the nipple with adjacent calcifications   05/21/2014 Initial Biopsy Right breast #1 at 7:00: Invasive lobular cancer ER+ (99%), PR+ (30%), Ki-67 5%, HER-2 negative (ratio 1.32), with LCIS. #2 at 6:00: Invasive ductal carcinoma ER+ (100%), PR+ (86%), Ki-67 15%, HER-2 negative (ratio 1.13)   05/26/2014 Breast MRI Right breast: 1.4 x 1.5 x 1cm inferior mass correlating to 7:00; second clip artifact at 6:00; abnormal linear and focal enhancement extending from 7:00 mass toward nipple. 3.3 x 3.4 x 4.1cm area of enhancement asymmetric from left breast   05/26/2014 Clinical Stage IDC: Stage IA (T1c N0 M0).  ILC: Stage 2A (T2 N0 M0)   06/02/2014 Procedure Bx of right breast subaerolar region: LCIS   06/12/2014 Procedure Genetic testing Ova Next gene panel Cephus Shelling) revealed no clinically significant variants for ATM, BARD1, BRCA1, BRCA2, BRIP1, CDH1, CHEK2, EPCAM, MLH1, MRE11A, MSH2, MSH6, MUTYH, NBN, NF1, PALB2, PMS2, PTEN, RAD50, RAD51C, RAD51D, SMARCA4, STK11, and TP53.   08/05/2014 Surgery Right Mastectomy with SLNB ([facility]): IDC, grade 2, spanning 1.2 cm. ER+ (100%), PR+ (86%), HER2 neu negative  (ratio 1.13), Ki-67 15%.  ILC, grade 2, spanning 2.4 cm, 0/4 LN Neg; ER+ (99%), PR+ (30%), HER2 negative (ratio 1.34)   08/05/2014 Pathologic Stage IDC: Stage IA (T1c N0 M0).  ILC: Stage 2A (T2 N0 M0)   08/18/2014 -  Anti-estrogen oral therapy Anastrozole 1 mg daily (Alucard [place]). Planned duration of treatment: 5 years.     10/31/2014 Survivorship A copy of the survivorship care plan was mailed to the patient in lieu of an in-person visit at her request.    Cancer of right breast ([place]) (Resolved)   08/05/2014 Initial Diagnosis Cancer of right breast    CHIEF COMPLIANT:  Follow-up on anastrozole  INTERVAL HISTORY: [name] is a  80 year old with above-mentioned history of right breast invasive ductal and invasive lobular cancers who is currently on anastrozole therapy. She appears to be tolerating it fairly well. She does have hot flashes, myalgias as well as insomnia. She has noticed some hair loss as well. She tells me that her brother passed away yesterday. She is in significant emotional stress from that. On top of it she has a pinched now which is bothering her.  REVIEW OF SYSTEMS:   Constitutional: Denies fevers, chills or abnormal weight loss Eyes: Denies blurriness of vision Ears, nose, mouth, throat, and face: Denies mucositis or sore throat Respiratory: Denies cough, dyspnea or wheezes Cardiovascular: Denies palpitation, chest discomfort or lower extremity swelling Gastrointestinal:  Denies nausea, heartburn or change in bowel habits Skin: Denies abnormal skin rashes Lymphatics: Denies new lymphadenopathy or easy bruising Neurological:Denies numbness, tingling  or new weaknesses Behavioral/Psych: Mood is stable, no new changes  All other systems were reviewed with the patient and are negative.  I have reviewed the past medical history, past surgical history, social history and family history with the patient and they are unchanged from previous note.  ALLERGIES:  is allergic to  iodine and sulfa antibiotics.  MEDICATIONS:  Current Outpatient Prescriptions  Medication Sig Dispense Refill  . acetaminophen (TYLENOL) 500 MG tablet Take 500 mg by mouth every 6 (six) hours as needed for mild pain or headache.    . ALPRAZolam (XANAX) 0.5 MG tablet Take 0.5 mg by mouth 3 (three) times daily as needed for sleep or anxiety.    Marland Kitchen anastrozole (ARIMIDEX) 1 MG tablet Take 1 tablet (1 mg total) by mouth daily. 90 tablet 3  . BENICAR 40 MG tablet Take 20 mg by mouth daily.     Marland Kitchen HYDROcodone-acetaminophen (NORCO/VICODIN) 5-325 MG per tablet Take 1-2 tablets by mouth every 4 (four) hours as needed for moderate pain or severe pain. (Patient not taking: Reported on 08/18/2014) 30 tablet 0  . levothyroxine (SYNTHROID, LEVOTHROID) 50 MCG tablet Take 50 mcg by mouth every morning.    . meclizine (ANTIVERT) 12.5 MG tablet Take 1 tablet (12.5 mg total) by mouth 3 (three) times daily. 20 tablet 0  . meclizine (ANTIVERT) 25 MG tablet TAKE 1 TABLET BY MOUTH EVERY 6 HOURS FOR VERTIGO  2  . metoprolol succinate (TOPROL-XL) 100 MG 24 hr tablet Take 100 mg by mouth daily. Take with or immediately following a meal.    . Multiple Vitamin (MULTIVITAMIN WITH MINERALS) TABS Take 1 tablet by mouth daily.    Marland Kitchen omeprazole (PRILOSEC) 20 MG capsule Take 20 mg by mouth daily.    Dellia Nims CALCIUM + D3 500-200 MG-UNIT TABS TAKE 1 TABLET BY MOUTH DAILY WITH BREAKFAST. 90 tablet 0   No current facility-administered medications for this visit.    PHYSICAL EXAMINATION: ECOG PERFORMANCE STATUS: 1 - Symptomatic but completely ambulatory  Filed Vitals:   11/25/14 1442  BP: 137/74  Pulse: 97  Temp: 98.2 F (36.8 C)  Resp: 18   Filed Weights   11/25/14 1442  Weight: 181 lb 11.2 oz (82.419 kg)    GENERAL:alert, no distress and comfortable SKIN: skin color, texture, turgor are normal, no rashes or significant lesions EYES: normal, Conjunctiva are pink and non-injected, sclera clear OROPHARYNX:no exudate, no  erythema and lips, buccal mucosa, and tongue normal  NECK: supple, thyroid normal size, non-tender, without nodularity LYMPH:  no palpable lymphadenopathy in the cervical, axillary or inguinal LUNGS: clear to auscultation and percussion with normal breathing effort HEART: regular rate & rhythm and no murmurs and no lower extremity edema ABDOMEN:abdomen soft, non-tender and normal bowel sounds Musculoskeletal:no cyanosis of digits and no clubbing  NEURO: alert & oriented x 3 with fluent speech, no focal motor/sensory deficits  LABORATORY DATA:  I have reviewed the data as listed   Chemistry      Component Value Date/Time   NA 138 07/31/2014 0557   K 4.8 07/31/2014 0557   CL 105 07/31/2014 0557   CO2 23 07/31/2014 0557   BUN 15 07/31/2014 0557   CREATININE 0.80 07/31/2014 0557      Component Value Date/Time   CALCIUM 8.8* 07/31/2014 0557   ALKPHOS 56 07/31/2014 0557   AST 20 07/31/2014 0557   ALT 18 07/31/2014 0557   BILITOT 0.4 07/31/2014 0557       Lab Results  Component  Value Date   WBC 11.1* 08/06/2014   HGB 11.7* 08/06/2014   HCT 35.2* 08/06/2014   MCV 91.4 08/06/2014   PLT 177 08/06/2014   NEUTROABS 5.2 07/30/2014   ASSESSMENT & PLAN:  Breast cancer of lower-outer quadrant of right female breast ([place]) Rt Mastectomy 08/05/14:  IDC Grade 2, 1.2 cm T1cN0 (stage 1A); ER 100%, PR 86%, Ki-67 15%, HER-2 negative ratio 1.13 ILC Grade 2, 2.4 cm T2N0 (stage 2A); 0/4 LN Neg; ER 99%, PR 30%, Ki-67 5%  Current treatment: Anti-estrogen therapy with anastrozole 1 mg daily. (Started approximately July 15) (I did not recommend Oncotype DX testing because of her elderly age and low Ki-67 score)  Anastrozole toxicities: 1.  Mild to moderate hot flashes 2.  Musculoskeletal pains in the upper extremities 3.  Insomnia 4.  Mild hair loss  Breast cancer surveillance: 1. Left breast mammograms annually 2. Breast exams every 4-6 months   Return to clinic in 6 months for  follow-up  No orders of the defined types were placed in this encounter.   The patient has a good understanding of the overall plan. she agrees with it. she will call with any problems that may develop before the next visit here.   Rulon Eisenmenger, MD

## 2014-11-25 NOTE — Telephone Encounter (Signed)
Appointments made and avs printed for patient °

## 2014-12-10 DIAGNOSIS — C50911 Malignant neoplasm of unspecified site of right female breast: Secondary | ICD-10-CM | POA: Diagnosis not present

## 2014-12-10 DIAGNOSIS — Z130 Encounter for screening for diseases of the blood and blood-forming organs and certain disorders involving the immune mechanism: Secondary | ICD-10-CM | POA: Diagnosis not present

## 2014-12-10 DIAGNOSIS — Z1389 Encounter for screening for other disorder: Secondary | ICD-10-CM | POA: Diagnosis not present

## 2014-12-10 DIAGNOSIS — L900 Lichen sclerosus et atrophicus: Secondary | ICD-10-CM | POA: Diagnosis not present

## 2014-12-10 DIAGNOSIS — Z01419 Encounter for gynecological examination (general) (routine) without abnormal findings: Secondary | ICD-10-CM | POA: Diagnosis not present

## 2014-12-22 DIAGNOSIS — L570 Actinic keratosis: Secondary | ICD-10-CM | POA: Diagnosis not present

## 2014-12-22 DIAGNOSIS — L821 Other seborrheic keratosis: Secondary | ICD-10-CM | POA: Diagnosis not present

## 2014-12-22 DIAGNOSIS — L218 Other seborrheic dermatitis: Secondary | ICD-10-CM | POA: Diagnosis not present

## 2014-12-22 DIAGNOSIS — L720 Epidermal cyst: Secondary | ICD-10-CM | POA: Diagnosis not present

## 2015-01-27 ENCOUNTER — Other Ambulatory Visit: Payer: Self-pay | Admitting: Hematology and Oncology

## 2015-01-27 NOTE — Telephone Encounter (Signed)
Chart reviewed.

## 2015-02-26 DIAGNOSIS — N390 Urinary tract infection, site not specified: Secondary | ICD-10-CM | POA: Diagnosis not present

## 2015-02-26 DIAGNOSIS — E784 Other hyperlipidemia: Secondary | ICD-10-CM | POA: Diagnosis not present

## 2015-02-26 DIAGNOSIS — I2510 Atherosclerotic heart disease of native coronary artery without angina pectoris: Secondary | ICD-10-CM | POA: Diagnosis not present

## 2015-02-26 DIAGNOSIS — I10 Essential (primary) hypertension: Secondary | ICD-10-CM | POA: Diagnosis not present

## 2015-02-26 DIAGNOSIS — E038 Other specified hypothyroidism: Secondary | ICD-10-CM | POA: Diagnosis not present

## 2015-02-26 DIAGNOSIS — R8299 Other abnormal findings in urine: Secondary | ICD-10-CM | POA: Diagnosis not present

## 2015-02-26 DIAGNOSIS — E559 Vitamin D deficiency, unspecified: Secondary | ICD-10-CM | POA: Diagnosis not present

## 2015-02-26 DIAGNOSIS — R7301 Impaired fasting glucose: Secondary | ICD-10-CM | POA: Diagnosis not present

## 2015-03-05 DIAGNOSIS — K5790 Diverticulosis of intestine, part unspecified, without perforation or abscess without bleeding: Secondary | ICD-10-CM | POA: Diagnosis not present

## 2015-03-05 DIAGNOSIS — R7301 Impaired fasting glucose: Secondary | ICD-10-CM | POA: Diagnosis not present

## 2015-03-05 DIAGNOSIS — K219 Gastro-esophageal reflux disease without esophagitis: Secondary | ICD-10-CM | POA: Diagnosis not present

## 2015-03-05 DIAGNOSIS — C50911 Malignant neoplasm of unspecified site of right female breast: Secondary | ICD-10-CM | POA: Diagnosis not present

## 2015-03-05 DIAGNOSIS — I8390 Asymptomatic varicose veins of unspecified lower extremity: Secondary | ICD-10-CM | POA: Diagnosis not present

## 2015-03-05 DIAGNOSIS — I2510 Atherosclerotic heart disease of native coronary artery without angina pectoris: Secondary | ICD-10-CM | POA: Diagnosis not present

## 2015-03-05 DIAGNOSIS — E038 Other specified hypothyroidism: Secondary | ICD-10-CM | POA: Diagnosis not present

## 2015-03-05 DIAGNOSIS — Z0000 Encounter for general adult medical examination without abnormal findings: Secondary | ICD-10-CM | POA: Diagnosis not present

## 2015-03-05 DIAGNOSIS — I10 Essential (primary) hypertension: Secondary | ICD-10-CM | POA: Diagnosis not present

## 2015-03-06 DIAGNOSIS — Z1212 Encounter for screening for malignant neoplasm of rectum: Secondary | ICD-10-CM | POA: Diagnosis not present

## 2015-05-28 ENCOUNTER — Ambulatory Visit (HOSPITAL_BASED_OUTPATIENT_CLINIC_OR_DEPARTMENT_OTHER): Payer: Commercial Managed Care - HMO | Admitting: Hematology and Oncology

## 2015-05-28 ENCOUNTER — Telehealth: Payer: Self-pay | Admitting: Hematology and Oncology

## 2015-05-28 ENCOUNTER — Encounter: Payer: Self-pay | Admitting: Hematology and Oncology

## 2015-05-28 VITALS — BP 126/69 | HR 88 | Temp 97.9°F | Resp 17 | Ht 60.0 in | Wt 187.3 lb

## 2015-05-28 DIAGNOSIS — R5383 Other fatigue: Secondary | ICD-10-CM | POA: Diagnosis not present

## 2015-05-28 DIAGNOSIS — C50511 Malignant neoplasm of lower-outer quadrant of right female breast: Secondary | ICD-10-CM | POA: Diagnosis not present

## 2015-05-28 DIAGNOSIS — G4700 Insomnia, unspecified: Secondary | ICD-10-CM | POA: Diagnosis not present

## 2015-05-28 NOTE — Progress Notes (Signed)
Unable to get in to exam room prior to MD.  No assessment performed.  

## 2015-05-28 NOTE — Telephone Encounter (Signed)
appt made and avs printed °

## 2015-05-28 NOTE — Progress Notes (Signed)
Patient Care Team: Prince Solian, MD as PCP - General (Internal Medicine) Excell Seltzer, MD as Consulting Physician (General Surgery) Nicholas Lose, MD as Consulting Physician (Hematology and Oncology) Sylvan Cheese, NP as Nurse Practitioner (Nurse Practitioner)  DIAGNOSIS: Breast cancer of lower-outer quadrant of right female breast [facility])   Staging form: Breast, AJCC 7th Edition     Pathologic stage from 08/05/2014: Stage IA (T1c, N0, cM0) - Unsigned     Pathologic stage from 08/05/2014: Stage IIA (T2, N0, cM0) - Unsigned     Clinical: Stage IA (T1c, N0, M0) - Unsigned     Clinical: Stage IIA (T2, N0, M0) - Unsigned   SUMMARY OF ONCOLOGIC HISTORY:   Breast cancer of lower-outer quadrant of right female breast ([place])   04/30/2014 Mammogram Right breast: 4 x 3 x 73m oval nodule at 5:30-6:00, 5 cm from the nipple with adjacent calcifications   05/21/2014 Initial Biopsy Right breast #1 at 7:00: Invasive lobular cancer ER+ (99%), PR+ (30%), Ki-67 5%, HER-2 negative (ratio 1.32), with LCIS. #2 at 6:00: Invasive ductal carcinoma ER+ (100%), PR+ (86%), Ki-67 15%, HER-2 negative (ratio 1.13)   05/26/2014 Breast MRI Right breast: 1.4 x 1.5 x 1cm inferior mass correlating to 7:00; second clip artifact at 6:00; abnormal linear and focal enhancement extending from 7:00 mass toward nipple. 3.3 x 3.4 x 4.1cm area of enhancement asymmetric from left breast   05/26/2014 Clinical Stage IDC: Stage IA (T1c N0 M0).  ILC: Stage 2A (T2 N0 M0)   06/02/2014 Procedure Bx of right breast subaerolar region: LCIS   06/12/2014 Procedure Genetic testing Ova Next gene panel (Cephus Shelling revealed no clinically significant variants for ATM, BARD1, BRCA1, BRCA2, BRIP1, CDH1, CHEK2, EPCAM, MLH1, MRE11A, MSH2, MSH6, MUTYH, NBN, NF1, PALB2, PMS2, PTEN, RAD50, RAD51C, RAD51D, SMARCA4, STK11, and TP53.   08/05/2014 Surgery Right Mastectomy with SLNB ([facility]): IDC, grade 2, spanning 1.2 cm. ER+ (100%), PR+ (86%), HER2 neu  negative (ratio 1.13), Ki-67 15%.  ILC, grade 2, spanning 2.4 cm, 0/4 LN Neg; ER+ (99%), PR+ (30%), HER2 negative (ratio 1.34)   08/05/2014 Pathologic Stage IDC: Stage IA (T1c N0 M0).  ILC: Stage 2A (T2 N0 M0)   08/18/2014 -  Anti-estrogen oral therapy Anastrozole 1 mg daily (Chinmayi Rumer). Planned duration of treatment: 5 years.     10/31/2014 Survivorship A copy of the survivorship care plan was mailed to the patient in lieu of an in-person visit at her request.    Cancer of right breast ([place] (Resolved)   08/05/2014 Initial Diagnosis Cancer of right breast    CHIEF COMPLIANT: follow-up on anastrozole  INTERVAL HISTORY: [name] is a severe with above-mentioned history of right breast cancer treated with right mastectomy and is currently on anastrozole therapy. She appears to be having moderate difficulties with anastrozole. She complains of proximal muscle aches and stiffness. Heart pressures have improved. She also has fatigue as well as severe insomnia.  REVIEW OF SYSTEMS:   Constitutional: Denies fevers, chills or abnormal weight loss Eyes: Denies blurriness of vision Ears, nose, mouth, throat, and face: Denies mucositis or sore throat Respiratory: Denies cough, dyspnea or wheezes Cardiovascular: Denies palpitation, chest discomfort Gastrointestinal:  Denies nausea, heartburn or change in bowel habits Skin: Denies abnormal skin rashes Lymphatics: Denies new lymphadenopathy or easy bruising Neurological:Denies numbness, tingling or new weaknesses Behavioral/Psych: Mood is stable, no new changes  Extremities: No lower extremity edema Breast:  denies any pain or lumps or nodules in either breasts All other systems were  reviewed with the patient and are negative.  I have reviewed the past medical history, past surgical history, social history and family history with the patient and they are unchanged from previous note.  ALLERGIES:  is allergic to iodine and sulfa  antibiotics.  MEDICATIONS:  Current Outpatient Prescriptions  Medication Sig Dispense Refill  . acetaminophen (TYLENOL) 500 MG tablet Take 500 mg by mouth every 6 (six) hours as needed for mild pain or headache.    . ALPRAZolam (XANAX) 0.5 MG tablet Take 0.5 mg by mouth 3 (three) times daily as needed for sleep or anxiety.    Marland Kitchen anastrozole (ARIMIDEX) 1 MG tablet Take 1 tablet (1 mg total) by mouth daily. 90 tablet 3  . BENICAR 40 MG tablet Take 20 mg by mouth daily.     Marland Kitchen HYDROcodone-acetaminophen (NORCO/VICODIN) 5-325 MG per tablet Take 1-2 tablets by mouth every 4 (four) hours as needed for moderate pain or severe pain. 30 tablet 0  . levothyroxine (SYNTHROID, LEVOTHROID) 50 MCG tablet Take 50 mcg by mouth every morning.    . meclizine (ANTIVERT) 12.5 MG tablet Take 1 tablet (12.5 mg total) by mouth 3 (three) times daily. 20 tablet 0  . meclizine (ANTIVERT) 25 MG tablet TAKE 1 TABLET BY MOUTH EVERY 6 HOURS FOR VERTIGO  2  . metoprolol succinate (TOPROL-XL) 100 MG 24 hr tablet Take 100 mg by mouth daily. Take with or immediately following a meal.    . Multiple Vitamin (MULTIVITAMIN WITH MINERALS) TABS Take 1 tablet by mouth daily.    Marland Kitchen omeprazole (PRILOSEC) 20 MG capsule Take 20 mg by mouth daily.    Dellia Nims CALCIUM + D3 500-200 MG-UNIT TABS TAKE 1 TABLET BY MOUTH DAILY WITH BREAKFAST. 90 tablet 1   No current facility-administered medications for this visit.    PHYSICAL EXAMINATION: ECOG PERFORMANCE STATUS: 1 - Symptomatic but completely ambulatory  Filed Vitals:   05/28/15 1422  BP: 126/69  Pulse: 88  Temp: 97.9 F (36.6 C)  Resp: 17   Filed Weights   05/28/15 1422  Weight: 187 lb 4.8 oz (84.959 kg)    GENERAL:alert, no distress and comfortable SKIN: skin color, texture, turgor are normal, no rashes or significant lesions EYES: normal, Conjunctiva are pink and non-injected, sclera clear OROPHARYNX:no exudate, no erythema and lips, buccal mucosa, and tongue normal  NECK:  supple, thyroid normal size, non-tender, without nodularity LYMPH:  no palpable lymphadenopathy in the cervical, axillary or inguinal LUNGS: clear to auscultation and percussion with normal breathing effort HEART: regular rate & rhythm and no murmurs and no lower extremity edema ABDOMEN:abdomen soft, non-tender and normal bowel sounds MUSCULOSKELETAL:no cyanosis of digits and no clubbing  NEURO: alert & oriented x 3 with fluent speech, no focal motor/sensory deficits EXTREMITIES: No lower extremity edema BREAST: No palpable masses or nodules in either right or left breasts. No palpable axillary supraclavicular or infraclavicular adenopathy no breast tenderness or nipple discharge. (exam performed in the presence of a chaperone)  LABORATORY DATA:  I have reviewed the data as listed   Chemistry      Component Value Date/Time   NA 138 07/31/2014 0557   K 4.8 07/31/2014 0557   CL 105 07/31/2014 0557   CO2 23 07/31/2014 0557   BUN 15 07/31/2014 0557   CREATININE 0.80 07/31/2014 0557      Component Value Date/Time   CALCIUM 8.8* 07/31/2014 0557   ALKPHOS 56 07/31/2014 0557   AST 20 07/31/2014 0557   ALT 18 07/31/2014  0557   BILITOT 0.4 07/31/2014 0557       Lab Results  Component Value Date   WBC 11.1* 08/06/2014   HGB 11.7* 08/06/2014   HCT 35.2* 08/06/2014   MCV 91.4 08/06/2014   PLT 177 08/06/2014   NEUTROABS 5.2 07/30/2014     ASSESSMENT & PLAN:  Breast cancer of lower-outer quadrant of right female breast ([place]) Rt Mastectomy 08/05/14:  IDC Grade 2, 1.2 cm T1cN0 (stage 1A); ER 100%, PR 86%, Ki-67 15%, HER-2 negative ratio 1.13 ILC Grade 2, 2.4 cm T2N0 (stage 2A); 0/4 LN Neg; ER 99%, PR 30%, Ki-67 5%  Current treatment: Anti-estrogen therapy with anastrozole 1 mg daily. (Started approximately 08/18/2014) (I did not recommend Oncotype DX testing because of her elderly age and low Ki-67 score)  Anastrozole toxicities: 1. Hot flashes have markedly improved 2.  Musculoskeletal pains in the upper extremities 3. Insomnia 4. Mild hair loss  Breast cancer surveillance: 1. Left breast mammograms annually to be done. Will send the order 2. Breast exam 05/28/2015 does not reveal any palpable lumps or nodules.  Return to clinic in 6 months for follow-up   No orders of the defined types were placed in this encounter.   The patient has a good understanding of the overall plan. she agrees with it. she will call with any problems that may develop before the next visit here.   Rulon Eisenmenger, MD 05/28/2015

## 2015-05-28 NOTE — Assessment & Plan Note (Signed)
Rt Mastectomy 08/05/14:  IDC Grade 2, 1.2 cm T1cN0 (stage 1A); ER 100%, PR 86%, Ki-67 15%, HER-2 negative ratio 1.13 ILC Grade 2, 2.4 cm T2N0 (stage 2A); 0/4 LN Neg; ER 99%, PR 30%, Ki-67 5%  Current treatment: Anti-estrogen therapy with anastrozole 1 mg daily. (Started approximately 08/18/2014) (I did not recommend Oncotype DX testing because of her elderly age and low Ki-67 score)  Anastrozole toxicities: 1. Mild to moderate hot flashes 2. Musculoskeletal pains in the upper extremities 3. Insomnia 4. Mild hair loss  Breast cancer surveillance: 1. Left breast mammograms annually to be done in June 2017 2. Breast exam 05/28/2015 does not reveal any palpable lumps or nodules.  Return to clinic in 6 months for follow-up

## 2015-05-29 ENCOUNTER — Other Ambulatory Visit: Payer: Self-pay | Admitting: Hematology and Oncology

## 2015-05-29 DIAGNOSIS — Z1231 Encounter for screening mammogram for malignant neoplasm of breast: Secondary | ICD-10-CM

## 2015-05-29 DIAGNOSIS — Z9011 Acquired absence of right breast and nipple: Secondary | ICD-10-CM

## 2015-06-02 DIAGNOSIS — C50911 Malignant neoplasm of unspecified site of right female breast: Secondary | ICD-10-CM | POA: Diagnosis not present

## 2015-06-02 DIAGNOSIS — R69 Illness, unspecified: Secondary | ICD-10-CM | POA: Diagnosis not present

## 2015-06-16 ENCOUNTER — Ambulatory Visit
Admission: RE | Admit: 2015-06-16 | Discharge: 2015-06-16 | Disposition: A | Discharge status: Home / Self Care | Payer: Commercial Managed Care - HMO | Source: Ambulatory Visit | Attending: Hematology and Oncology | Admitting: Hematology and Oncology

## 2015-06-16 DIAGNOSIS — Z1231 Encounter for screening mammogram for malignant neoplasm of breast: Secondary | ICD-10-CM | POA: Diagnosis not present

## 2015-06-16 DIAGNOSIS — Z9011 Acquired absence of right breast and nipple: Secondary | ICD-10-CM

## 2015-07-07 DIAGNOSIS — C50911 Malignant neoplasm of unspecified site of right female breast: Secondary | ICD-10-CM | POA: Diagnosis not present

## 2015-07-07 DIAGNOSIS — Z6835 Body mass index (BMI) 35.0-35.9, adult: Secondary | ICD-10-CM | POA: Diagnosis not present

## 2015-07-07 DIAGNOSIS — R7301 Impaired fasting glucose: Secondary | ICD-10-CM | POA: Diagnosis not present

## 2015-07-07 DIAGNOSIS — F419 Anxiety disorder, unspecified: Secondary | ICD-10-CM | POA: Diagnosis not present

## 2015-07-07 DIAGNOSIS — G4700 Insomnia, unspecified: Secondary | ICD-10-CM | POA: Diagnosis not present

## 2015-07-07 DIAGNOSIS — I10 Essential (primary) hypertension: Secondary | ICD-10-CM | POA: Diagnosis not present

## 2015-07-09 DIAGNOSIS — H5203 Hypermetropia, bilateral: Secondary | ICD-10-CM | POA: Diagnosis not present

## 2015-07-09 DIAGNOSIS — H5210 Myopia, unspecified eye: Secondary | ICD-10-CM | POA: Diagnosis not present

## 2015-07-27 ENCOUNTER — Other Ambulatory Visit: Payer: Self-pay | Admitting: Hematology and Oncology

## 2015-08-08 ENCOUNTER — Other Ambulatory Visit: Payer: Self-pay | Admitting: Hematology and Oncology

## 2015-09-01 DIAGNOSIS — Z780 Asymptomatic menopausal state: Secondary | ICD-10-CM | POA: Diagnosis not present

## 2015-09-01 DIAGNOSIS — E559 Vitamin D deficiency, unspecified: Secondary | ICD-10-CM | POA: Diagnosis not present

## 2015-10-19 DIAGNOSIS — C50911 Malignant neoplasm of unspecified site of right female breast: Secondary | ICD-10-CM | POA: Diagnosis not present

## 2015-10-22 DIAGNOSIS — Z23 Encounter for immunization: Secondary | ICD-10-CM | POA: Diagnosis not present

## 2015-11-05 DIAGNOSIS — C50911 Malignant neoplasm of unspecified site of right female breast: Secondary | ICD-10-CM | POA: Diagnosis not present

## 2015-11-19 DIAGNOSIS — I10 Essential (primary) hypertension: Secondary | ICD-10-CM | POA: Diagnosis not present

## 2015-11-19 DIAGNOSIS — C50911 Malignant neoplasm of unspecified site of right female breast: Secondary | ICD-10-CM | POA: Diagnosis not present

## 2015-11-19 DIAGNOSIS — N390 Urinary tract infection, site not specified: Secondary | ICD-10-CM | POA: Diagnosis not present

## 2015-11-19 DIAGNOSIS — Z6834 Body mass index (BMI) 34.0-34.9, adult: Secondary | ICD-10-CM | POA: Diagnosis not present

## 2015-11-19 DIAGNOSIS — L209 Atopic dermatitis, unspecified: Secondary | ICD-10-CM | POA: Diagnosis not present

## 2015-11-19 DIAGNOSIS — E038 Other specified hypothyroidism: Secondary | ICD-10-CM | POA: Diagnosis not present

## 2015-11-19 DIAGNOSIS — R8290 Unspecified abnormal findings in urine: Secondary | ICD-10-CM | POA: Diagnosis not present

## 2015-11-19 DIAGNOSIS — Z800 Family history of malignant neoplasm of digestive organs: Secondary | ICD-10-CM | POA: Diagnosis not present

## 2015-11-26 ENCOUNTER — Ambulatory Visit (HOSPITAL_BASED_OUTPATIENT_CLINIC_OR_DEPARTMENT_OTHER): Payer: Commercial Managed Care - HMO | Admitting: Hematology and Oncology

## 2015-11-26 ENCOUNTER — Encounter: Payer: Self-pay | Admitting: Hematology and Oncology

## 2015-11-26 DIAGNOSIS — L659 Nonscarring hair loss, unspecified: Secondary | ICD-10-CM | POA: Diagnosis not present

## 2015-11-26 DIAGNOSIS — C50511 Malignant neoplasm of lower-outer quadrant of right female breast: Secondary | ICD-10-CM | POA: Diagnosis not present

## 2015-11-26 DIAGNOSIS — G4700 Insomnia, unspecified: Secondary | ICD-10-CM | POA: Diagnosis not present

## 2015-11-26 DIAGNOSIS — Z170 Estrogen receptor positive status [ER+]: Secondary | ICD-10-CM

## 2015-11-26 NOTE — Progress Notes (Signed)
Patient Care Team: Prince Solian, MD as PCP - General (Internal Medicine) Excell Seltzer, MD as Consulting Physician (General Surgery) Nicholas Lose, MD as Consulting Physician (Hematology and Oncology) Sylvan Cheese, NP as Nurse Practitioner (Nurse Practitioner)  DIAGNOSIS: Breast cancer of lower-outer quadrant of right female breast [facility])   Staging form: Breast, AJCC 7th Edition   - Pathologic stage from 08/05/2014: Stage IA (T1c, N0, cM0) - Unsigned   - Pathologic stage from 08/05/2014: Stage IIA (T2, N0, cM0) - Unsigned   - Clinical: Stage IA (T1c, N0, M0) - Unsigned   - Clinical: Stage IIA (T2, N0, M0) - Unsigned  SUMMARY OF ONCOLOGIC HISTORY:   Breast cancer of lower-outer quadrant of right female breast ([place])   04/30/2014 Mammogram    Right breast: 4 x 3 x 68m oval nodule at 5:30-6:00, 5 cm from the nipple with adjacent calcifications      05/21/2014 Initial Biopsy    Right breast #1 at 7:00: Invasive lobular cancer ER+ (99%), PR+ (30%), Ki-67 5%, HER-2 negative (ratio 1.32), with LCIS. #2 at 6:00: Invasive ductal carcinoma ER+ (100%), PR+ (86%), Ki-67 15%, HER-2 negative (ratio 1.13)      05/26/2014 Breast MRI    Right breast: 1.4 x 1.5 x 1cm inferior mass correlating to 7:00; second clip artifact at 6:00; abnormal linear and focal enhancement extending from 7:00 mass toward nipple. 3.3 x 3.4 x 4.1cm area of enhancement asymmetric from left breast      05/26/2014 Clinical Stage    IDC: Stage IA (T1c N0 M0).  ILC: Stage 2A (T2 N0 M0)      06/02/2014 Procedure    Bx of right breast subaerolar region: LCIS      06/12/2014 Procedure    Genetic testing Ova Next gene panel (Cephus Shelling revealed no clinically significant variants for ATM, BARD1, BRCA1, BRCA2, BRIP1, CDH1, CHEK2, EPCAM, MLH1, MRE11A, MSH2, MSH6, MUTYH, NBN, NF1, PALB2, PMS2, PTEN, RAD50, RAD51C, RAD51D, SMARCA4, STK11, and TP53.      08/05/2014 Surgery    Right Mastectomy with SLNB ([facility]): IDC, grade 2,  spanning 1.2 cm. ER+ (100%), PR+ (86%), HER2 neu negative (ratio 1.13), Ki-67 15%.  ILC, grade 2, spanning 2.4 cm, 0/4 LN Neg; ER+ (99%), PR+ (30%), HER2 negative (ratio 1.34)      08/05/2014 Pathologic Stage    IDC: Stage IA (T1c N0 M0).  ILC: Stage 2A (T2 N0 M0)      08/18/2014 -  Anti-estrogen oral therapy    Anastrozole 1 mg daily (Brennen Camper). Planned duration of treatment: 5 years.        10/31/2014 Survivorship    A copy of the survivorship care plan was mailed to the patient in lieu of an in-person visit at her request.       Cancer of right breast ([place] (Resolved)   08/05/2014 Initial Diagnosis    Cancer of right breast       CHIEF COMPLIANT: Right breast cancer on anastrozole  INTERVAL HISTORY: Jasmine SHENOYis a 81year old with above-mentioned history of right breast cancer is currently on anastrozole therapy. She is tolerating it moderately well. She continues to have occasional hot flashes and musculoskeletal pains. She has noticed some hair loss as well. She has difficulty with sleeping which is not improved significantly. Denies any lumps or nodules in the breast.  REVIEW OF SYSTEMS:   Constitutional: Denies fevers, chills or abnormal weight loss Eyes: Denies blurriness of vision Ears, nose, mouth, throat, and face: Denies mucositis or  sore throat Respiratory: Denies cough, dyspnea or wheezes Cardiovascular: Denies palpitation, chest discomfort Gastrointestinal:  Denies nausea, heartburn or change in bowel habits Skin: Denies abnormal skin rashes Lymphatics: Denies new lymphadenopathy or easy bruising Neurological:Denies numbness, tingling or new weaknesses Behavioral/Psych: Mood is stable, no new changes  Extremities: No lower extremity edema Breast:  denies any pain or lumps or nodules in either breasts All other systems were reviewed with the patient and are negative.  I have reviewed the past medical history, past surgical history, social history and family  history with the patient and they are unchanged from previous note.  ALLERGIES:  is allergic to iodine and sulfa antibiotics.  MEDICATIONS:  Current Outpatient Prescriptions  Medication Sig Dispense Refill  . acetaminophen (TYLENOL) 500 MG tablet Take 500 mg by mouth every 6 (six) hours as needed for mild pain or headache.    . ALPRAZolam (XANAX) 0.5 MG tablet Take 0.5 mg by mouth 3 (three) times daily as needed for sleep or anxiety.    Marland Kitchen anastrozole (ARIMIDEX) 1 MG tablet TAKE 1 TABLET (1 MG TOTAL) BY MOUTH DAILY. 90 tablet 3  . BENICAR 40 MG tablet Take 20 mg by mouth daily.     Marland Kitchen levothyroxine (SYNTHROID, LEVOTHROID) 50 MCG tablet Take 50 mcg by mouth every morning.    . meclizine (ANTIVERT) 12.5 MG tablet Take 1 tablet (12.5 mg total) by mouth 3 (three) times daily. 20 tablet 0  . meclizine (ANTIVERT) 25 MG tablet TAKE 1 TABLET BY MOUTH EVERY 6 HOURS FOR VERTIGO  2  . metoprolol succinate (TOPROL-XL) 100 MG 24 hr tablet Take 100 mg by mouth daily. Take with or immediately following a meal.    . Multiple Vitamin (MULTIVITAMIN WITH MINERALS) TABS Take 1 tablet by mouth daily.    Marland Kitchen omeprazole (PRILOSEC) 20 MG capsule Take 20 mg by mouth daily.    Dellia Nims CALCIUM + D3 500-200 MG-UNIT TABS TAKE 1 TABLET BY MOUTH DAILY WITH BREAKFAST. 90 tablet 1   No current facility-administered medications for this visit.     PHYSICAL EXAMINATION: ECOG PERFORMANCE STATUS: 1 - Symptomatic but completely ambulatory  There were no vitals filed for this visit. There were no vitals filed for this visit.  GENERAL:alert, no distress and comfortable SKIN: skin color, texture, turgor are normal, no rashes or significant lesions EYES: normal, Conjunctiva are pink and non-injected, sclera clear OROPHARYNX:no exudate, no erythema and lips, buccal mucosa, and tongue normal  NECK: supple, thyroid normal size, non-tender, without nodularity LYMPH:  no palpable lymphadenopathy in the cervical, axillary or  inguinal LUNGS: clear to auscultation and percussion with normal breathing effort HEART: regular rate & rhythm and no murmurs and no lower extremity edema ABDOMEN:abdomen soft, non-tender and normal bowel sounds MUSCULOSKELETAL:no cyanosis of digits and no clubbing  NEURO: alert & oriented x 3 with fluent speech, no focal motor/sensory deficits EXTREMITIES: No lower extremity edema BREAST: No palpable masses or nodules in either right or left breasts. No palpable axillary supraclavicular or infraclavicular adenopathy no breast tenderness or nipple discharge. (exam performed in the presence of a chaperone)  LABORATORY DATA:  I have reviewed the data as listed   Chemistry      Component Value Date/Time   NA 138 07/31/2014 0557   K 4.8 07/31/2014 0557   CL 105 07/31/2014 0557   CO2 23 07/31/2014 0557   BUN 15 07/31/2014 0557   CREATININE 0.80 07/31/2014 0557      Component Value Date/Time   CALCIUM  8.8 (L) 07/31/2014 0557   ALKPHOS 56 07/31/2014 0557   AST 20 07/31/2014 0557   ALT 18 07/31/2014 0557   BILITOT 0.4 07/31/2014 0557       Lab Results  Component Value Date   WBC 11.1 (H) 08/06/2014   HGB 11.7 (L) 08/06/2014   HCT 35.2 (L) 08/06/2014   MCV 91.4 08/06/2014   PLT 177 08/06/2014   NEUTROABS 5.2 07/30/2014     ASSESSMENT & PLAN:  Breast cancer of lower-outer quadrant of right female breast ([place]) Rt Mastectomy 08/05/14:  IDC Grade 2, 1.2 cm T1cN0 (stage 1A); ER 100%, PR 86%, Ki-67 15%, HER-2 negative ratio 1.13 ILC Grade 2, 2.4 cm T2N0 (stage 2A); 0/4 LN Neg; ER 99%, PR 30%, Ki-67 5%  Current treatment: Anti-estrogen therapy with anastrozole 1 mg daily. (Started approximately 08/18/2014) (I did not recommend Oncotype DX testing because of her elderly age and low Ki-67 score)  Anastrozole toxicities: 1. Hot flashes have markedly improved 2. Musculoskeletal pains in the upper extremities 3. Insomnia: I discussed with her that stress is playing a part in her  lack of sleep. 4. Mild hair loss  Breast cancer surveillance: 1. Left breast mammograms 06/16/2015: Breast density category B, no findings suspicious for malignancy. 2. Breast exam 05/28/2015 does not reveal any palpable lumps or nodules.  Return to clinic in 1 year for follow-up   No orders of the defined types were placed in this encounter.  The patient has a good understanding of the overall plan. she agrees with it. she will call with any problems that may develop before the next visit here.   Rulon Eisenmenger, MD 11/26/15

## 2015-11-26 NOTE — Assessment & Plan Note (Signed)
Rt Mastectomy 08/05/14:  IDC Grade 2, 1.2 cm T1cN0 (stage 1A); ER 100%, PR 86%, Ki-67 15%, HER-2 negative ratio 1.13 ILC Grade 2, 2.4 cm T2N0 (stage 2A); 0/4 LN Neg; ER 99%, PR 30%, Ki-67 5%  Current treatment: Anti-estrogen therapy with anastrozole 1 mg daily. (Started approximately 08/18/2014) (I did not recommend Oncotype DX testing because of her elderly age and low Ki-67 score)  Anastrozole toxicities: 1. Hot flashes have markedly improved 2. Musculoskeletal pains in the upper extremities 3. Insomnia 4. Mild hair loss  Breast cancer surveillance: 1. Left breast mammograms annually to be done. Will send the order 2. Breast exam 05/28/2015 does not reveal any palpable lumps or nodules.  Return to clinic in 6 months for follow-up

## 2015-12-02 DIAGNOSIS — C50911 Malignant neoplasm of unspecified site of right female breast: Secondary | ICD-10-CM | POA: Diagnosis not present

## 2015-12-16 DIAGNOSIS — R7301 Impaired fasting glucose: Secondary | ICD-10-CM | POA: Diagnosis not present

## 2015-12-16 DIAGNOSIS — I10 Essential (primary) hypertension: Secondary | ICD-10-CM | POA: Diagnosis not present

## 2015-12-16 DIAGNOSIS — G4700 Insomnia, unspecified: Secondary | ICD-10-CM | POA: Diagnosis not present

## 2015-12-16 DIAGNOSIS — L209 Atopic dermatitis, unspecified: Secondary | ICD-10-CM | POA: Diagnosis not present

## 2015-12-16 DIAGNOSIS — F419 Anxiety disorder, unspecified: Secondary | ICD-10-CM | POA: Diagnosis not present

## 2015-12-16 DIAGNOSIS — E038 Other specified hypothyroidism: Secondary | ICD-10-CM | POA: Diagnosis not present

## 2015-12-16 DIAGNOSIS — M8580 Other specified disorders of bone density and structure, unspecified site: Secondary | ICD-10-CM | POA: Diagnosis not present

## 2016-01-22 DIAGNOSIS — Z86010 Personal history of colonic polyps: Secondary | ICD-10-CM | POA: Diagnosis not present

## 2016-01-22 DIAGNOSIS — K5730 Diverticulosis of large intestine without perforation or abscess without bleeding: Secondary | ICD-10-CM | POA: Diagnosis not present

## 2016-01-22 DIAGNOSIS — Z800 Family history of malignant neoplasm of digestive organs: Secondary | ICD-10-CM | POA: Diagnosis not present

## 2016-02-03 ENCOUNTER — Other Ambulatory Visit: Payer: Self-pay | Admitting: Hematology and Oncology

## 2016-02-29 DIAGNOSIS — L603 Nail dystrophy: Secondary | ICD-10-CM | POA: Diagnosis not present

## 2016-02-29 DIAGNOSIS — D2239 Melanocytic nevi of other parts of face: Secondary | ICD-10-CM | POA: Diagnosis not present

## 2016-02-29 DIAGNOSIS — L720 Epidermal cyst: Secondary | ICD-10-CM | POA: Diagnosis not present

## 2016-02-29 DIAGNOSIS — I788 Other diseases of capillaries: Secondary | ICD-10-CM | POA: Diagnosis not present

## 2016-02-29 DIAGNOSIS — L570 Actinic keratosis: Secondary | ICD-10-CM | POA: Diagnosis not present

## 2016-02-29 DIAGNOSIS — L821 Other seborrheic keratosis: Secondary | ICD-10-CM | POA: Diagnosis not present

## 2016-02-29 DIAGNOSIS — L245 Irritant contact dermatitis due to other chemical products: Secondary | ICD-10-CM | POA: Diagnosis not present

## 2016-03-08 DIAGNOSIS — I10 Essential (primary) hypertension: Secondary | ICD-10-CM | POA: Diagnosis not present

## 2016-03-08 DIAGNOSIS — E784 Other hyperlipidemia: Secondary | ICD-10-CM | POA: Diagnosis not present

## 2016-03-08 DIAGNOSIS — R7301 Impaired fasting glucose: Secondary | ICD-10-CM | POA: Diagnosis not present

## 2016-03-08 DIAGNOSIS — R8299 Other abnormal findings in urine: Secondary | ICD-10-CM | POA: Diagnosis not present

## 2016-03-08 DIAGNOSIS — E038 Other specified hypothyroidism: Secondary | ICD-10-CM | POA: Diagnosis not present

## 2016-03-08 DIAGNOSIS — M859 Disorder of bone density and structure, unspecified: Secondary | ICD-10-CM | POA: Diagnosis not present

## 2016-03-15 DIAGNOSIS — Z0000 Encounter for general adult medical examination without abnormal findings: Secondary | ICD-10-CM | POA: Diagnosis not present

## 2016-03-15 DIAGNOSIS — Z23 Encounter for immunization: Secondary | ICD-10-CM | POA: Diagnosis not present

## 2016-03-15 DIAGNOSIS — I10 Essential (primary) hypertension: Secondary | ICD-10-CM | POA: Diagnosis not present

## 2016-03-15 DIAGNOSIS — Z800 Family history of malignant neoplasm of digestive organs: Secondary | ICD-10-CM | POA: Diagnosis not present

## 2016-03-15 DIAGNOSIS — I2510 Atherosclerotic heart disease of native coronary artery without angina pectoris: Secondary | ICD-10-CM | POA: Diagnosis not present

## 2016-03-15 DIAGNOSIS — E784 Other hyperlipidemia: Secondary | ICD-10-CM | POA: Diagnosis not present

## 2016-03-15 DIAGNOSIS — C50911 Malignant neoplasm of unspecified site of right female breast: Secondary | ICD-10-CM | POA: Diagnosis not present

## 2016-03-15 DIAGNOSIS — F3340 Major depressive disorder, recurrent, in remission, unspecified: Secondary | ICD-10-CM | POA: Diagnosis not present

## 2016-03-15 DIAGNOSIS — R7301 Impaired fasting glucose: Secondary | ICD-10-CM | POA: Diagnosis not present

## 2016-03-15 DIAGNOSIS — E038 Other specified hypothyroidism: Secondary | ICD-10-CM | POA: Diagnosis not present

## 2016-04-05 DIAGNOSIS — Z6835 Body mass index (BMI) 35.0-35.9, adult: Secondary | ICD-10-CM | POA: Diagnosis not present

## 2016-04-05 DIAGNOSIS — J209 Acute bronchitis, unspecified: Secondary | ICD-10-CM | POA: Diagnosis not present

## 2016-04-05 DIAGNOSIS — I10 Essential (primary) hypertension: Secondary | ICD-10-CM | POA: Diagnosis not present

## 2016-04-05 DIAGNOSIS — R05 Cough: Secondary | ICD-10-CM | POA: Diagnosis not present

## 2016-05-01 ENCOUNTER — Telehealth: Payer: Self-pay

## 2016-05-01 NOTE — Telephone Encounter (Signed)
Called and left a message with new appt   [name]

## 2016-06-01 ENCOUNTER — Ambulatory Visit: Payer: Commercial Managed Care - HMO | Admitting: Hematology and Oncology

## 2016-06-13 ENCOUNTER — Encounter: Payer: Self-pay | Admitting: Hematology and Oncology

## 2016-06-13 ENCOUNTER — Ambulatory Visit (HOSPITAL_BASED_OUTPATIENT_CLINIC_OR_DEPARTMENT_OTHER): Payer: Medicare HMO | Admitting: Hematology and Oncology

## 2016-06-13 ENCOUNTER — Ambulatory Visit (HOSPITAL_COMMUNITY)
Admission: RE | Admit: 2016-06-13 | Discharge: 2016-06-13 | Disposition: A | Discharge status: Home / Self Care | Payer: Medicare HMO | Source: Ambulatory Visit | Attending: Hematology and Oncology | Admitting: Hematology and Oncology

## 2016-06-13 DIAGNOSIS — Z9181 History of falling: Secondary | ICD-10-CM | POA: Diagnosis not present

## 2016-06-13 DIAGNOSIS — Z170 Estrogen receptor positive status [ER+]: Secondary | ICD-10-CM | POA: Insufficient documentation

## 2016-06-13 DIAGNOSIS — M791 Myalgia: Secondary | ICD-10-CM | POA: Diagnosis not present

## 2016-06-13 DIAGNOSIS — C50511 Malignant neoplasm of lower-outer quadrant of right female breast: Secondary | ICD-10-CM | POA: Diagnosis not present

## 2016-06-13 DIAGNOSIS — L659 Nonscarring hair loss, unspecified: Secondary | ICD-10-CM

## 2016-06-13 DIAGNOSIS — S299XXA Unspecified injury of thorax, initial encounter: Secondary | ICD-10-CM | POA: Diagnosis not present

## 2016-06-13 DIAGNOSIS — G4700 Insomnia, unspecified: Secondary | ICD-10-CM | POA: Diagnosis not present

## 2016-06-13 DIAGNOSIS — R0789 Other chest pain: Secondary | ICD-10-CM

## 2016-06-13 DIAGNOSIS — R079 Chest pain, unspecified: Secondary | ICD-10-CM | POA: Diagnosis not present

## 2016-06-13 NOTE — Progress Notes (Signed)
Patient Care Team: Prince Solian, MD as PCP - General (Internal Medicine) Excell Seltzer, MD as Consulting Physician (General Surgery) Nicholas Lose, MD as Consulting Physician (Hematology and Oncology) Sylvan Cheese, NP as Nurse Practitioner (Nurse Practitioner)  DIAGNOSIS:  Encounter Diagnosis  Name Primary?  . Malignant neoplasm of lower-outer quadrant of right breast of female, estrogen receptor positive ([place])     SUMMARY OF ONCOLOGIC HISTORY:   Breast cancer of lower-outer quadrant of right female breast ([place])   04/30/2014 Mammogram    Right breast: 4 x 3 x 55m oval nodule at 5:30-6:00, 5 cm from the nipple with adjacent calcifications      05/21/2014 Initial Biopsy    Right breast #1 at 7:00: Invasive lobular cancer ER+ (99%), PR+ (30%), Ki-67 5%, HER-2 negative (ratio 1.32), with LCIS. #2 at 6:00: Invasive ductal carcinoma ER+ (100%), PR+ (86%), Ki-67 15%, HER-2 negative (ratio 1.13)      05/26/2014 Breast MRI    Right breast: 1.4 x 1.5 x 1cm inferior mass correlating to 7:00; second clip artifact at 6:00; abnormal linear and focal enhancement extending from 7:00 mass toward nipple. 3.3 x 3.4 x 4.1cm area of enhancement asymmetric from left breast      05/26/2014 Clinical Stage    IDC: Stage IA (T1c N0 M0).  ILC: Stage 2A (T2 N0 M0)      06/02/2014 Procedure    Bx of right breast subaerolar region: LCIS      06/12/2014 Procedure    Genetic testing Ova Next gene panel (Cephus Shelling revealed no clinically significant variants for ATM, BARD1, BRCA1, BRCA2, BRIP1, CDH1, CHEK2, EPCAM, MLH1, MRE11A, MSH2, MSH6, MUTYH, NBN, NF1, PALB2, PMS2, PTEN, RAD50, RAD51C, RAD51D, SMARCA4, STK11, and TP53.      08/05/2014 Surgery    Right Mastectomy with SLNB ([facility]): IDC, grade 2, spanning 1.2 cm. ER+ (100%), PR+ (86%), HER2 neu negative (ratio 1.13), Ki-67 15%.  ILC, grade 2, spanning 2.4 cm, 0/4 LN Neg; ER+ (99%), PR+ (30%), HER2 negative (ratio 1.34)      08/05/2014 Pathologic  Stage    IDC: Stage IA (T1c N0 M0).  ILC: Stage 2A (T2 N0 M0)      08/18/2014 -  Anti-estrogen oral therapy    Anastrozole 1 mg daily (Aminta Sakurai). Planned duration of treatment: 5 years.        10/31/2014 Survivorship    A copy of the survivorship care plan was mailed to the patient in lieu of an in-person visit at her request.       [place]   08/05/2014 Initial Diagnosis    Cancer of right breast       CHIEF COMPLIANT: Recent fall in the bathroom with severe pain in the ribs and sharp pain when she takes a deep inspiration  INTERVAL HISTORY: Jasmine DOBRANSKYis a 82year old with above-mentioned history of right breast cancer treated with mastectomy followed by adjuvant anastrozole therapy since 2016. She has been tolerating anastrozole extremely well. Recently she fell in the bathroom after slipping on the floor and hurt her ribs. She did not hurt her head. She did not go to emergency room. She is here today to discuss his symptoms.  REVIEW OF SYSTEMS:   Constitutional: Denies fevers, chills or abnormal weight loss Eyes: Denies blurriness of vision Ears, nose, mouth, throat, and face: Denies mucositis or sore throat Respiratory: Denies cough, dyspnea or wheezes Cardiovascular: Denies palpitation, chest discomfort Gastrointestinal:  Denies nausea, heartburn or change in bowel  habits Skin: Denies abnormal skin rashes Lymphatics: Denies new lymphadenopathy or easy bruising Neurological:Denies numbness, tingling or new weaknesses Behavioral/Psych: Mood is stable, no new changes  Extremities: No lower extremity edema Breast:  denies any pain or lumps or nodules in either breasts All other systems were reviewed with the patient and are negative.  I have reviewed the past medical history, past surgical history, social history and family history with the patient and they are unchanged from previous note.  ALLERGIES:  is allergic to iodine and sulfa  antibiotics.  MEDICATIONS:  Current Outpatient Prescriptions  Medication Sig Dispense Refill  . acetaminophen (TYLENOL) 500 MG tablet Take 500 mg by mouth every 6 (six) hours as needed for mild pain or headache.    . ALPRAZolam (XANAX) 0.5 MG tablet Take 0.5 mg by mouth 3 (three) times daily as needed for sleep or anxiety.    Marland Kitchen anastrozole (ARIMIDEX) 1 MG tablet TAKE 1 TABLET (1 MG TOTAL) BY MOUTH DAILY. 90 tablet 3  . BENICAR 40 MG tablet Take 20 mg by mouth daily.     Marland Kitchen levothyroxine (SYNTHROID, LEVOTHROID) 50 MCG tablet Take 50 mcg by mouth every morning.    . meclizine (ANTIVERT) 12.5 MG tablet Take 1 tablet (12.5 mg total) by mouth 3 (three) times daily. 20 tablet 0  . meclizine (ANTIVERT) 25 MG tablet TAKE 1 TABLET BY MOUTH EVERY 6 HOURS FOR VERTIGO  2  . metoprolol succinate (TOPROL-XL) 100 MG 24 hr tablet Take 100 mg by mouth daily. Take with or immediately following a meal.    . Multiple Vitamin (MULTIVITAMIN WITH MINERALS) TABS Take 1 tablet by mouth daily.    Marland Kitchen omeprazole (PRILOSEC) 20 MG capsule Take 20 mg by mouth daily.    Dellia Nims CALCIUM + D3 500-200 MG-UNIT TABS TAKE 1 TABLET BY MOUTH DAILY WITH BREAKFAST. 90 tablet 1   No current facility-administered medications for this visit.     PHYSICAL EXAMINATION: ECOG PERFORMANCE STATUS: 1 - Symptomatic but completely ambulatory  Vitals:   06/13/16 1508  BP: (!) 151/65  Pulse: 84  Resp: 18  Temp: 97.7 F (36.5 C)   Filed Weights   06/13/16 1508  Weight: 183 lb 14.4 oz (83.4 kg)    GENERAL:alert, no distress and comfortable SKIN: skin color, texture, turgor are normal, no rashes or significant lesions EYES: normal, Conjunctiva are pink and non-injected, sclera clear OROPHARYNX:no exudate, no erythema and lips, buccal mucosa, and tongue normal  NECK: supple, thyroid normal size, non-tender, without nodularity LYMPH:  no palpable lymphadenopathy in the cervical, axillary or inguinal LUNGS: clear to auscultation and  percussion with normal breathing effort, Pain when she takes a deep inspiration the right chest wall tenderness HEART: regular rate & rhythm and no murmurs and no lower extremity edema ABDOMEN:abdomen soft, non-tender and normal bowel sounds MUSCULOSKELETAL:no cyanosis of digits and no clubbing  NEURO: alert & oriented x 3 with fluent speech, no focal motor/sensory deficits EXTREMITIES: No lower extremity edema  LABORATORY DATA:  I have reviewed the data as listed   Chemistry      Component Value Date/Time   NA 138 07/31/2014 0557   K 4.8 07/31/2014 0557   CL 105 07/31/2014 0557   CO2 23 07/31/2014 0557   BUN 15 07/31/2014 0557   CREATININE 0.80 07/31/2014 0557      Component Value Date/Time   CALCIUM 8.8 (L) 07/31/2014 0557   ALKPHOS 56 07/31/2014 0557   AST 20 07/31/2014 0557   ALT 18  07/31/2014 0557   BILITOT 0.4 07/31/2014 0557       Lab Results  Component Value Date   WBC 11.1 (H) 08/06/2014   HGB 11.7 (L) 08/06/2014   HCT 35.2 (L) 08/06/2014   MCV 91.4 08/06/2014   PLT 177 08/06/2014   NEUTROABS 5.2 07/30/2014    ASSESSMENT & PLAN:  Breast cancer of lower-outer quadrant of right female breast ([place]) Rt Mastectomy 08/05/14:  IDC Grade 2, 1.2 cm T1cN0 (stage 1A); ER 100%, PR 86%, Ki-67 15%, HER-2 negative ratio 1.13 ILC Grade 2, 2.4 cm T2N0 (stage 2A); 0/4 LN Neg; ER 99%, PR 30%, Ki-67 5%  Current treatment: Anti-estrogen therapy with anastrozole 1 mg daily. (Started approximately 08/18/2014) (I did not recommend Oncotype DX testing because of her elderly age and low Ki-67 score)  Anastrozole toxicities: 1. Hot flashes have markedly improved 2. Musculoskeletal pains in the upper extremities 3. Insomnia: I discussed with her that stress is playing a part in her lack of sleep. 4. Mild hair loss  Breast cancer surveillance: 1. Left breast mammograms 06/16/2015: Breast density category B, no findings suspicious for malignancy. 2. Breast exam 06/13/2016  does not reveal any palpable lumps or nodules.  Pain in the right chest wall related to recent fall: I'm obtaining a chest x-ray to evaluate this further. I will call her with the results of x-ray. Return to clinic in 1 year for follow-up   I spent 25 minutes talking to the patient of which more than half was spent in counseling and coordination of care.  No orders of the defined types were placed in this encounter.  The patient has a good understanding of the overall plan. she agrees with it. she will call with any problems that may develop before the next visit here.   Rulon Eisenmenger, MD 06/13/16

## 2016-06-13 NOTE — Assessment & Plan Note (Addendum)
Rt Mastectomy 08/05/14:  IDC Grade 2, 1.2 cm T1cN0 (stage 1A); ER 100%, PR 86%, Ki-67 15%, HER-2 negative ratio 1.13 ILC Grade 2, 2.4 cm T2N0 (stage 2A); 0/4 LN Neg; ER 99%, PR 30%, Ki-67 5%  Current treatment: Anti-estrogen therapy with anastrozole 1 mg daily. (Started approximately 08/18/2014) (I did not recommend Oncotype DX testing because of her elderly age and low Ki-67 score)  Anastrozole toxicities: 1. Hot flashes have markedly improved 2. Musculoskeletal pains in the upper extremities 3. Insomnia: I discussed with her that stress is playing a part in her lack of sleep. 4. Mild hair loss  Breast cancer surveillance: 1. Left breast mammograms 06/16/2015: Breast density category B, no findings suspicious for malignancy. 2. Breast exam 06/13/2016 does not reveal any palpable lumps or nodules.  Return to clinic in 1 year for follow-up

## 2016-07-20 DIAGNOSIS — H5203 Hypermetropia, bilateral: Secondary | ICD-10-CM | POA: Diagnosis not present

## 2016-07-26 ENCOUNTER — Other Ambulatory Visit: Payer: Self-pay | Admitting: Hematology and Oncology

## 2016-07-28 ENCOUNTER — Other Ambulatory Visit: Payer: Self-pay | Admitting: Hematology and Oncology

## 2016-07-28 DIAGNOSIS — Z9011 Acquired absence of right breast and nipple: Secondary | ICD-10-CM

## 2016-07-28 DIAGNOSIS — Z1231 Encounter for screening mammogram for malignant neoplasm of breast: Secondary | ICD-10-CM

## 2016-08-02 DIAGNOSIS — Z6834 Body mass index (BMI) 34.0-34.9, adult: Secondary | ICD-10-CM | POA: Diagnosis not present

## 2016-08-02 DIAGNOSIS — F3340 Major depressive disorder, recurrent, in remission, unspecified: Secondary | ICD-10-CM | POA: Diagnosis not present

## 2016-08-02 DIAGNOSIS — R7301 Impaired fasting glucose: Secondary | ICD-10-CM | POA: Diagnosis not present

## 2016-08-02 DIAGNOSIS — R42 Dizziness and giddiness: Secondary | ICD-10-CM | POA: Diagnosis not present

## 2016-08-02 DIAGNOSIS — E038 Other specified hypothyroidism: Secondary | ICD-10-CM | POA: Diagnosis not present

## 2016-08-02 DIAGNOSIS — C50911 Malignant neoplasm of unspecified site of right female breast: Secondary | ICD-10-CM | POA: Diagnosis not present

## 2016-08-02 DIAGNOSIS — I10 Essential (primary) hypertension: Secondary | ICD-10-CM | POA: Diagnosis not present

## 2016-08-02 DIAGNOSIS — F419 Anxiety disorder, unspecified: Secondary | ICD-10-CM | POA: Diagnosis not present

## 2016-08-10 ENCOUNTER — Ambulatory Visit
Admission: RE | Admit: 2016-08-10 | Discharge: 2016-08-10 | Disposition: A | Discharge status: Home / Self Care | Payer: Medicare HMO | Source: Ambulatory Visit | Attending: Hematology and Oncology | Admitting: Hematology and Oncology

## 2016-08-10 ENCOUNTER — Ambulatory Visit: Payer: Medicare HMO

## 2016-08-10 DIAGNOSIS — Z1231 Encounter for screening mammogram for malignant neoplasm of breast: Secondary | ICD-10-CM | POA: Diagnosis not present

## 2016-08-10 DIAGNOSIS — Z9011 Acquired absence of right breast and nipple: Secondary | ICD-10-CM

## 2016-08-17 DIAGNOSIS — C50911 Malignant neoplasm of unspecified site of right female breast: Secondary | ICD-10-CM | POA: Diagnosis not present

## 2016-08-31 ENCOUNTER — Other Ambulatory Visit: Payer: Self-pay | Admitting: Hematology and Oncology

## 2016-11-15 DIAGNOSIS — Z23 Encounter for immunization: Secondary | ICD-10-CM | POA: Diagnosis not present

## 2016-12-13 ENCOUNTER — Ambulatory Visit: Payer: Medicare HMO | Admitting: Hematology and Oncology

## 2017-01-19 DIAGNOSIS — R69 Illness, unspecified: Secondary | ICD-10-CM | POA: Diagnosis not present

## 2017-02-02 ENCOUNTER — Other Ambulatory Visit: Payer: Self-pay | Admitting: Hematology and Oncology

## 2017-03-14 DIAGNOSIS — I10 Essential (primary) hypertension: Secondary | ICD-10-CM | POA: Diagnosis not present

## 2017-03-14 DIAGNOSIS — R82998 Other abnormal findings in urine: Secondary | ICD-10-CM | POA: Diagnosis not present

## 2017-03-14 DIAGNOSIS — E7849 Other hyperlipidemia: Secondary | ICD-10-CM | POA: Diagnosis not present

## 2017-03-14 DIAGNOSIS — E038 Other specified hypothyroidism: Secondary | ICD-10-CM | POA: Diagnosis not present

## 2017-03-14 DIAGNOSIS — E559 Vitamin D deficiency, unspecified: Secondary | ICD-10-CM | POA: Diagnosis not present

## 2017-03-14 DIAGNOSIS — R7301 Impaired fasting glucose: Secondary | ICD-10-CM | POA: Diagnosis not present

## 2017-03-21 DIAGNOSIS — E7849 Other hyperlipidemia: Secondary | ICD-10-CM | POA: Diagnosis not present

## 2017-03-21 DIAGNOSIS — I2510 Atherosclerotic heart disease of native coronary artery without angina pectoris: Secondary | ICD-10-CM | POA: Diagnosis not present

## 2017-03-21 DIAGNOSIS — Z0000 Encounter for general adult medical examination without abnormal findings: Secondary | ICD-10-CM | POA: Diagnosis not present

## 2017-03-21 DIAGNOSIS — I10 Essential (primary) hypertension: Secondary | ICD-10-CM | POA: Diagnosis not present

## 2017-03-21 DIAGNOSIS — I8390 Asymptomatic varicose veins of unspecified lower extremity: Secondary | ICD-10-CM | POA: Diagnosis not present

## 2017-03-21 DIAGNOSIS — F3340 Major depressive disorder, recurrent, in remission, unspecified: Secondary | ICD-10-CM | POA: Diagnosis not present

## 2017-03-21 DIAGNOSIS — R7301 Impaired fasting glucose: Secondary | ICD-10-CM | POA: Diagnosis not present

## 2017-03-21 DIAGNOSIS — G4709 Other insomnia: Secondary | ICD-10-CM | POA: Diagnosis not present

## 2017-03-21 DIAGNOSIS — C50911 Malignant neoplasm of unspecified site of right female breast: Secondary | ICD-10-CM | POA: Diagnosis not present

## 2017-03-22 ENCOUNTER — Other Ambulatory Visit: Payer: Self-pay

## 2017-03-22 DIAGNOSIS — I83813 Varicose veins of bilateral lower extremities with pain: Secondary | ICD-10-CM

## 2017-03-27 DIAGNOSIS — Z1212 Encounter for screening for malignant neoplasm of rectum: Secondary | ICD-10-CM | POA: Diagnosis not present

## 2017-03-31 ENCOUNTER — Telehealth: Payer: Self-pay

## 2017-03-31 NOTE — Telephone Encounter (Signed)
Left a voice mail concerning appointment changes will will also mail letter. Per 2/8 reschedling

## 2017-04-18 DIAGNOSIS — I872 Venous insufficiency (chronic) (peripheral): Secondary | ICD-10-CM | POA: Diagnosis not present

## 2017-04-18 DIAGNOSIS — L720 Epidermal cyst: Secondary | ICD-10-CM | POA: Diagnosis not present

## 2017-04-18 DIAGNOSIS — I8311 Varicose veins of right lower extremity with inflammation: Secondary | ICD-10-CM | POA: Diagnosis not present

## 2017-04-18 DIAGNOSIS — D485 Neoplasm of uncertain behavior of skin: Secondary | ICD-10-CM | POA: Diagnosis not present

## 2017-04-18 DIAGNOSIS — L570 Actinic keratosis: Secondary | ICD-10-CM | POA: Diagnosis not present

## 2017-04-18 DIAGNOSIS — L821 Other seborrheic keratosis: Secondary | ICD-10-CM | POA: Diagnosis not present

## 2017-04-18 DIAGNOSIS — D2261 Melanocytic nevi of right upper limb, including shoulder: Secondary | ICD-10-CM | POA: Diagnosis not present

## 2017-04-18 DIAGNOSIS — I8312 Varicose veins of left lower extremity with inflammation: Secondary | ICD-10-CM | POA: Diagnosis not present

## 2017-04-18 DIAGNOSIS — C44319 Basal cell carcinoma of skin of other parts of face: Secondary | ICD-10-CM | POA: Diagnosis not present

## 2017-04-24 DIAGNOSIS — Z85828 Personal history of other malignant neoplasm of skin: Secondary | ICD-10-CM | POA: Diagnosis not present

## 2017-04-24 DIAGNOSIS — C44319 Basal cell carcinoma of skin of other parts of face: Secondary | ICD-10-CM | POA: Diagnosis not present

## 2017-05-12 ENCOUNTER — Other Ambulatory Visit: Payer: Self-pay

## 2017-05-12 MED ORDER — CALCIUM CARB-CHOLECALCIFEROL 500-200 MG-UNIT PO TABS: 1.0000 | ORAL_TABLET | Freq: Every day | ORAL | 1 refills | Status: DC

## 2017-05-16 ENCOUNTER — Other Ambulatory Visit: Payer: Self-pay

## 2017-05-16 ENCOUNTER — Encounter: Payer: Self-pay | Admitting: Vascular Surgery

## 2017-05-16 ENCOUNTER — Ambulatory Visit (HOSPITAL_COMMUNITY)
Admission: RE | Admit: 2017-05-16 | Discharge: 2017-05-16 | Disposition: A | Discharge status: Home / Self Care | Payer: Medicare HMO | Source: Ambulatory Visit | Attending: Vascular Surgery | Admitting: Vascular Surgery

## 2017-05-16 ENCOUNTER — Ambulatory Visit: Payer: Medicare HMO | Admitting: Vascular Surgery

## 2017-05-16 VITALS — BP 131/82 | HR 80 | Temp 97.5°F | Resp 20 | Ht 60.0 in | Wt 180.0 lb

## 2017-05-16 DIAGNOSIS — M7121 Synovial cyst of popliteal space [Baker], right knee: Secondary | ICD-10-CM | POA: Insufficient documentation

## 2017-05-16 DIAGNOSIS — I83813 Varicose veins of bilateral lower extremities with pain: Secondary | ICD-10-CM

## 2017-05-16 NOTE — Progress Notes (Signed)
Vascular and Vein Specialist of [facility]  Patient name: [name] MRN: [id number] DOB: 09/03/1934 Sex: female  REASON FOR CONSULT: Valuation lower extremity pain with large telangiectasia bilaterally  HPI: [name] is a 83 y.o. female, who is here today for evaluation.  She is here with her daughter.  She has multiple components of discomfort in her lower extremities and these have been going on for several years.  She reports aching and tightness in both lower extremities.  This is somewhat worse in her right leg than her left.  She reports a sensation behind her right knee but feels as though there is a drawing sensation and tightness into her calf and down into her foot.  She does not have similar symptoms in her left leg.  She reports at times that she has to have assistance swinging her legs in and out of a car.  Also has a hard time getting in and out of a bathtub due to this tightness.  Does not have any history of DVT.  No history of arterial insufficiency.  She does have multiple large telangiectasia extending above the surface of her skin.  There is a plexus in her right popliteal fossa in her left lateral calf that calls pain.  The other when she reports is painless.  No history of DVT.  Past Medical History:  Diagnosis Date  . Arthritis    ra  . C. difficile colitis   . Cancer ([place])   . Complication of anesthesia    Concern about HOB positioning--putting head flat can trigger vertigo/N/V  . Hearing loss    right  . History of hiatal hernia   . Hypertension   . Hypothyroidism   . Plantar fasciitis   . Restless leg syndrome   . Thyroid disease   . Vertigo     Family History  Problem Relation Age of Onset  . Cancer Sister 63       breast  . Cancer Brother 59       colorectal cancer  . Cancer Sister 37       atyipical breast hyperplasia -  mastectomy  . Cancer Other 24       niece (daughter of sister with atypical  breast cells) with breast cancer   . Cancer Other 57       nephew (son of brother with colorectal ca) with coloreactal cancer  . Cancer Other        great niece (brother's daughter's daughter) with ovarian cancer  . Breast cancer Neg Hx     SOCIAL HISTORY: Social History   Socioeconomic History  . Marital status: Married    Spouse name: Not on file  . Number of children: Not on file  . Years of education: Not on file  . Highest education level: Not on file  Occupational History  . Not on file  Social Needs  . Financial resource strain: Not on file  . Food insecurity:    Worry: Not on file    Inability: Not on file  . Transportation needs:    Medical: Not on file    Non-medical: Not on file  Tobacco Use  . Smoking status: Never Smoker  . Smokeless tobacco: Never Used  Substance and Sexual Activity  . Alcohol use: No  . Drug use: No  . Sexual activity: Not on file  Lifestyle  . Physical activity:    Days per week: Not on file    Minutes  per session: Not on file  . Stress: Not on file  Relationships  . Social connections:    Talks on phone: Not on file    Gets together: Not on file    Attends religious service: Not on file    Active member of club or organization: Not on file    Attends meetings of clubs or organizations: Not on file    Relationship status: Not on file  . Intimate partner violence:    Fear of current or ex partner: Not on file    Emotionally abused: Not on file    Physically abused: Not on file    Forced sexual activity: Not on file  Other Topics Concern  . Not on file  Social History Narrative  . Not on file    Allergies  Allergen Reactions  . Iodine Rash  . Sulfa Antibiotics Rash    Current Outpatient Medications  Medication Sig Dispense Refill  . ALPRAZolam (XANAX) 0.5 MG tablet Take 0.5 mg by mouth 3 (three) times daily as needed for sleep or anxiety.    Marland Kitchen anastrozole (ARIMIDEX) 1 MG tablet TAKE 1 TABLET (1 MG TOTAL) BY MOUTH DAILY.  90 tablet 3  . BENICAR 40 MG tablet Take 20 mg by mouth daily.     . Calcium Carb-Cholecalciferol (OS-CAL CALCIUM + D3) 500-200 MG-UNIT TABS Take 1 tablet by mouth daily with breakfast. 90 tablet 1  . clobetasol cream (TEMOVATE) 9.37 % Apply 1 application topically 2 (two) times daily.    . diclofenac sodium (VOLTAREN) 1 % GEL Apply topically 4 (four) times daily.    . irbesartan (AVAPRO) 75 MG tablet Take 75 mg by mouth daily.    Marland Kitchen levothyroxine (SYNTHROID, LEVOTHROID) 50 MCG tablet Take 50 mcg by mouth every morning.    . meloxicam (MOBIC) 15 MG tablet Take 15 mg by mouth daily.    . metoprolol succinate (TOPROL-XL) 100 MG 24 hr tablet Take 100 mg by mouth daily. Take with or immediately following a meal.    . Multiple Vitamin (MULTIVITAMIN WITH MINERALS) TABS Take 1 tablet by mouth daily.    Marland Kitchen omeprazole (PRILOSEC) 20 MG capsule Take 20 mg by mouth daily.    Marland Kitchen acetaminophen (TYLENOL) 500 MG tablet Take 500 mg by mouth every 6 (six) hours as needed for mild pain or headache.    Marland Kitchen aspirin EC 81 MG tablet Take 81 mg by mouth daily.    . meclizine (ANTIVERT) 25 MG tablet TAKE 1 TABLET BY MOUTH EVERY 6 HOURS FOR VERTIGO  2   No current facility-administered medications for this visit.     REVIEW OF SYSTEMS:  [X]  denotes positive finding, [ ]  denotes negative finding Cardiac  Comments:  Chest pain or chest pressure:    Shortness of breath upon exertion:    Short of breath when lying flat:    Irregular heart rhythm:        Vascular    Pain in calf, thigh, or hip brought on by ambulation: x   Pain in feet at night that wakes you up from your sleep:     Blood clot in your veins:    Leg swelling:  x       Pulmonary    Oxygen at home:    Productive cough:     Wheezing:         Neurologic    Sudden weakness in arms or legs:     Sudden numbness in arms or legs:  Sudden onset of difficulty speaking or slurred speech:    Temporary loss of vision in one eye:     Problems with  dizziness:         Gastrointestinal    Blood in stool:     Vomited blood:         Genitourinary    Burning when urinating:     Blood in urine:        Psychiatric    Major depression:         Hematologic    Bleeding problems:    Problems with blood clotting too easily:        Skin    Rashes or ulcers:        Constitutional    Fever or chills:      PHYSICAL EXAM: Vitals:   05/16/17 1211  BP: 131/82  Pulse: 80  Resp: 20  Temp: (!) 97.5 F (36.4 C)  TempSrc: Oral  SpO2: 94%  Weight: 180 lb (81.6 kg)  Height: 5' (1.524 m)    GENERAL: The patient is a well-nourished female, in no acute distress. The vital signs are documented above. CARDIOVASCULAR: 2+ dorsalis pedis pulses bilaterally and 2+ radial pulses bilaterally.  He does have extensive raised telangiectasia over both lower extremities.  No true large varicosities.  No changes suggestive of venous hypertension such as hemosiderin deposits PULMONARY: There is good air exchange  ABDOMEN: Soft and non-tender  MUSCULOSKELETAL: There are no major deformities or cyanosis. NEUROLOGIC: No focal weakness or paresthesias are detected. SKIN: There are no ulcers or rashes noted. PSYCHIATRIC: The patient has a normal affect.  DATA:  Her lower extremity venous reflux showed some reflux in her common femoral vein bilaterally she does have reflux in her proximal right great saphenous vein but no dilatation in her right or left saphenous vein  MEDICAL ISSUES: Had long discussion with the patient.  I explained that fortunately she has no evidence of arterial insufficiency by physical exam with normal distal pulses.  Also explained that her duplex shows no significant venous hypertension.  I do not feel that her symptoms regarding weakness and stiffness is related to venous pathology.  She is having pain specifically over a large plexus of varicosities behind her popliteal fossa and feel that she would have improvement of symptoms with  sclerotherapy of this and we have discussed this as an option.  She does wear a light grade support hose but is unable to tolerate compression garments due to both inability to wear these and place them on and also severe pain when she tried them.  She will elevate her legs when possible and was reassured that this is not a limb threatening situation.   Rosetta Posner, MD FACS [facility] of [facility] Tel [phone] Pager [phone]

## 2017-06-13 ENCOUNTER — Ambulatory Visit: Payer: Medicare HMO | Admitting: Hematology and Oncology

## 2017-06-16 DIAGNOSIS — C50911 Malignant neoplasm of unspecified site of right female breast: Secondary | ICD-10-CM | POA: Diagnosis not present

## 2017-06-22 ENCOUNTER — Inpatient Hospital Stay: Payer: Medicare HMO | Attending: Hematology and Oncology | Admitting: Hematology and Oncology

## 2017-06-22 ENCOUNTER — Telehealth: Payer: Self-pay | Admitting: Hematology and Oncology

## 2017-06-22 DIAGNOSIS — Z79899 Other long term (current) drug therapy: Secondary | ICD-10-CM | POA: Diagnosis not present

## 2017-06-22 DIAGNOSIS — C50511 Malignant neoplasm of lower-outer quadrant of right female breast: Secondary | ICD-10-CM | POA: Diagnosis not present

## 2017-06-22 DIAGNOSIS — Z7982 Long term (current) use of aspirin: Secondary | ICD-10-CM | POA: Insufficient documentation

## 2017-06-22 DIAGNOSIS — R232 Flushing: Secondary | ICD-10-CM | POA: Insufficient documentation

## 2017-06-22 DIAGNOSIS — Z79811 Long term (current) use of aromatase inhibitors: Secondary | ICD-10-CM | POA: Insufficient documentation

## 2017-06-22 DIAGNOSIS — Z170 Estrogen receptor positive status [ER+]: Secondary | ICD-10-CM | POA: Insufficient documentation

## 2017-06-22 DIAGNOSIS — G4700 Insomnia, unspecified: Secondary | ICD-10-CM | POA: Insufficient documentation

## 2017-06-22 NOTE — Assessment & Plan Note (Signed)
Rt Mastectomy 08/05/14:  IDC Grade 2, 1.2 cm T1cN0 (stage 1A); ER 100%, PR 86%, Ki-67 15%, HER-2 negative ratio 1.13 ILC Grade 2, 2.4 cm T2N0 (stage 2A); 0/4 LN Neg; ER 99%, PR 30%, Ki-67 5%  Current treatment: Anti-estrogen therapy with anastrozole 1 mg daily. (Started approximately 08/18/2014) (I did not recommend Oncotype DX testing because of her elderly age and low Ki-67 score)  Anastrozole toxicities: 1. Hot flashes have markedly improved 2. Musculoskeletal pains in the upper extremities 3. Insomnia: I discussed with her that stress is playing a part in her lack of sleep. 4. Mild hair loss  Breast cancer surveillance: 1. Left breast mammograms 08/10/2016: Breast density category B, no findings suspicious for malignancy. 2. Breast exam  06/22/2017 does not reveal any palpable lumps or nodules.  Return to clinic in 1 yearfor follow-up

## 2017-06-22 NOTE — Telephone Encounter (Signed)
Gave patient AVs and calendar of upcoming may 2020 appointments °

## 2017-06-22 NOTE — Progress Notes (Signed)
Patient Care Team: Jasmine Solian, MD as PCP - General (Internal Medicine) Jasmine Seltzer, MD as Consulting Physician (General Surgery) Jasmine Lose, MD as Consulting Physician (Hematology and Oncology) Jasmine Cheese, NP as Nurse Practitioner (Nurse Practitioner)  DIAGNOSIS:  Encounter Diagnosis  Name Primary?  . Malignant neoplasm of lower-outer quadrant of right breast of female, estrogen receptor positive ([place])     SUMMARY OF ONCOLOGIC HISTORY:   Breast cancer of lower-outer quadrant of right female breast ([place])   04/30/2014 Mammogram    Right breast: 4 x 3 x 73m oval nodule at 5:30-6:00, 5 cm from the nipple with adjacent calcifications      05/21/2014 Initial Biopsy    Right breast #1 at 7:00: Invasive lobular cancer ER+ (99%), PR+ (30%), Ki-67 5%, HER-2 negative (ratio 1.32), with LCIS. #2 at 6:00: Invasive ductal carcinoma ER+ (100%), PR+ (86%), Ki-67 15%, HER-2 negative (ratio 1.13)      05/26/2014 Breast MRI    Right breast: 1.4 x 1.5 x 1cm inferior mass correlating to 7:00; second clip artifact at 6:00; abnormal linear and focal enhancement extending from 7:00 mass toward nipple. 3.3 x 3.4 x 4.1cm area of enhancement asymmetric from left breast      05/26/2014 Clinical Stage    IDC: Stage IA (T1c N0 M0).  ILC: Stage 2A (T2 N0 M0)      06/02/2014 Procedure    Bx of right breast subaerolar region: LCIS      06/12/2014 Procedure    Genetic testing Ova Next gene panel ([name] revealed no clinically significant variants for ATM, BARD1, BRCA1, BRCA2, BRIP1, CDH1, CHEK2, EPCAM, MLH1, MRE11A, MSH2, MSH6, MUTYH, NBN, NF1, PALB2, PMS2, PTEN, RAD50, RAD51C, RAD51D, SMARCA4, STK11, and TP53.      08/05/2014 Surgery    Right Mastectomy with SLNB ([facility]): IDC, grade 2, spanning 1.2 cm. ER+ (100%), PR+ (86%), HER2 neu negative (ratio 1.13), Ki-67 15%.  ILC, grade 2, spanning 2.4 cm, 0/4 LN Neg; ER+ (99%), PR+ (30%), HER2 negative (ratio 1.34)      08/05/2014  Pathologic Stage    IDC: Stage IA (T1c N0 M0).  ILC: Stage 2A (T2 N0 M0)      08/18/2014 -  Anti-estrogen oral therapy    Anastrozole 1 mg daily ([name]). Planned duration of treatment: 5 years.        10/31/2014 Survivorship    A copy of the survivorship care plan was mailed to the patient in lieu of an in-person visit at her request.       [place]   08/05/2014 Initial Diagnosis    Cancer of right breast       CHIEF COMPLIANT: Surveillance of right breast cancer  INTERVAL HISTORY: [name] a 83year old with above-mentioned history of right breast cancer who is currently on antiestrogen therapy with anastrozole since 2016.  She appears to be tolerating anastrozole fairly well.  She denies any lumps or nodules in the breast.  REVIEW OF SYSTEMS:   Constitutional: Denies fevers, chills or abnormal weight loss Eyes: Denies blurriness of vision Ears, nose, mouth, throat, and face: Denies mucositis or sore throat Respiratory: Denies cough, dyspnea or wheezes Cardiovascular: Denies palpitation, chest discomfort Gastrointestinal:  Denies nausea, heartburn or change in bowel habits Skin: Denies abnormal skin rashes Lymphatics: Denies new lymphadenopathy or easy bruising Neurological:Denies numbness, tingling or new weaknesses Behavioral/Psych: Mood is stable, no new changes  Extremities: No lower extremity edema Breast:  denies any pain or lumps  or nodules in either breasts All other systems were reviewed with the patient and are negative.  I have reviewed the past medical history, past surgical history, social history and family history with the patient and they are unchanged from previous note.  ALLERGIES:  is allergic to iodine and sulfa antibiotics.  MEDICATIONS:  Current Outpatient Medications  Medication Sig Dispense Refill  . acetaminophen (TYLENOL) 500 MG tablet Take 500 mg by mouth every 6 (six) hours as needed for mild pain or  headache.    . ALPRAZolam (XANAX) 0.5 MG tablet Take 0.5 mg by mouth 3 (three) times daily as needed for sleep or anxiety.    Marland Kitchen anastrozole (ARIMIDEX) 1 MG tablet TAKE 1 TABLET (1 MG TOTAL) BY MOUTH DAILY. 90 tablet 3  . aspirin EC 81 MG tablet Take 81 mg by mouth daily.    . Calcium Carb-Cholecalciferol (OS-CAL CALCIUM + D3) 500-200 MG-UNIT TABS Take 1 tablet by mouth daily with breakfast. 90 tablet 1  . clobetasol cream (TEMOVATE) 6.30 % Apply 1 application topically 2 (two) times daily.    . diclofenac sodium (VOLTAREN) 1 % GEL Apply topically 4 (four) times daily.    . irbesartan (AVAPRO) 75 MG tablet Take 75 mg by mouth daily.    Marland Kitchen levothyroxine (SYNTHROID, LEVOTHROID) 50 MCG tablet Take 50 mcg by mouth every morning.    . meclizine (ANTIVERT) 25 MG tablet TAKE 1 TABLET BY MOUTH EVERY 6 HOURS FOR VERTIGO  2  . meloxicam (MOBIC) 15 MG tablet Take 15 mg by mouth daily.    . metoprolol succinate (TOPROL-XL) 100 MG 24 hr tablet Take 100 mg by mouth daily. Take with or immediately following a meal.    . Multiple Vitamin (MULTIVITAMIN WITH MINERALS) TABS Take 1 tablet by mouth daily.    Marland Kitchen omeprazole (PRILOSEC) 20 MG capsule Take 20 mg by mouth daily.     No current facility-administered medications for this visit.     PHYSICAL EXAMINATION: ECOG PERFORMANCE STATUS: 1 - Symptomatic but completely ambulatory  Vitals:   06/22/17 1135  BP: (!) 146/66  Pulse: 80  Resp: 18  Temp: 97.9 F (36.6 C)  SpO2: 96%   Filed Weights   06/22/17 1135  Weight: 182 lb 9.6 oz (82.8 kg)    GENERAL:alert, no distress and comfortable SKIN: skin color, texture, turgor are normal, no rashes or significant lesions EYES: normal, Conjunctiva are pink and non-injected, sclera clear OROPHARYNX:no exudate, no erythema and lips, buccal mucosa, and tongue normal  NECK: supple, thyroid normal size, non-tender, without nodularity LYMPH:  no palpable lymphadenopathy in the cervical, axillary or inguinal LUNGS:  clear to auscultation and percussion with normal breathing effort HEART: regular rate & rhythm and no murmurs and no lower extremity edema ABDOMEN:abdomen soft, non-tender and normal bowel sounds MUSCULOSKELETAL:no cyanosis of digits and no clubbing  NEURO: alert & oriented x 3 with fluent speech, no focal motor/sensory deficits EXTREMITIES: No lower extremity edema BREAST: No palpable masses or nodules in either right or left breasts. No palpable axillary supraclavicular or infraclavicular adenopathy no breast tenderness or nipple discharge. (exam performed in the presence of a chaperone)  LABORATORY DATA:  I have reviewed the data as listed CMP Latest Ref Rng & Units 07/31/2014 07/30/2014 07/30/2014  Glucose 65 - 99 mg/dL 144(H) 106(H) 110(H)  BUN 6 - 20 mg/dL _0 Creatinine 0.44 - 1.00 mg/dL 0.80 0.80 0.80  Sodium 135 - 145 mmol/L 138 139 139  Potassium 3.5 - 5.1  mmol/L 4.8 4.3 4.4  Chloride 101 - 111 mmol/L 105 104 106  CO2 22 - 32 mmol/L 23 - 26  Calcium 8.9 - 10.3 mg/dL 8.8(L) - 9.1  Total Protein 6.5 - 8.1 g/dL 6.5 - 6.4(L)  Total Bilirubin 0.3 - 1.2 mg/dL 0.4 - 0.4  Alkaline Phos 38 - 126 U/L 56 - 60  AST 15 - 41 U/L 20 - 24  ALT 14 - 54 U/L 18 - 20    Lab Results  Component Value Date   WBC 11.1 (H) 08/06/2014   HGB 11.7 (L) 08/06/2014   HCT 35.2 (L) 08/06/2014   MCV 91.4 08/06/2014   PLT 177 08/06/2014   NEUTROABS 5.2 07/30/2014    ASSESSMENT & PLAN:  Breast cancer of lower-outer quadrant of right female breast ([place]) Rt Mastectomy 08/05/14:  IDC Grade 2, 1.2 cm T1cN0 (stage 1A); ER 100%, PR 86%, Ki-67 15%, HER-2 negative ratio 1.13 ILC Grade 2, 2.4 cm T2N0 (stage 2A); 0/4 LN Neg; ER 99%, PR 30%, Ki-67 5%  Current treatment: Anti-estrogen therapy with anastrozole 1 mg daily. (Started approximately 08/18/2014) (I did not recommend Oncotype DX testing because of her elderly age and low Ki-67 score)  Anastrozole toxicities: 1. Hot flashes have markedly  improved 2. Musculoskeletal pains in the lower extremities: I instructed the patient to hold anastrozole for a couple of weeks and see if her muscle aches and pains improved.  If the do improve then we can consider switching her to a different antiestrogen therapy like letrozole or even tamoxifen. She needs to continue this for 2 more years.  3. Insomnia: This is been a lifelong problem. 4. Mild hair loss  Breast cancer surveillance: 1. Left breast mammograms 08/10/2016: Breast density category B, no findings suspicious for malignancy. 2. Breast exam  06/22/2017 does not reveal any palpable lumps or nodules.  Return to clinic in 1 yearfor follow-up  No orders of the defined types were placed in this encounter.  The patient has a good understanding of the overall plan. she agrees with it. she will call with any problems that may develop before the next visit here.   Harriette Ohara, MD 06/22/17

## 2017-07-11 DIAGNOSIS — L821 Other seborrheic keratosis: Secondary | ICD-10-CM | POA: Diagnosis not present

## 2017-07-11 DIAGNOSIS — Z85828 Personal history of other malignant neoplasm of skin: Secondary | ICD-10-CM | POA: Diagnosis not present

## 2017-07-11 DIAGNOSIS — L570 Actinic keratosis: Secondary | ICD-10-CM | POA: Diagnosis not present

## 2017-07-14 ENCOUNTER — Other Ambulatory Visit: Payer: Self-pay | Admitting: Hematology and Oncology

## 2017-07-14 ENCOUNTER — Telehealth: Payer: Self-pay

## 2017-07-14 DIAGNOSIS — Z1231 Encounter for screening mammogram for malignant neoplasm of breast: Secondary | ICD-10-CM

## 2017-07-14 NOTE — Telephone Encounter (Signed)
Received VM from patient, it was not clear, pt left name, DOB, and said something about "probably aching legs" but then call dropped and could not hear the rest of the VM.  Attempted to call pt back, no answer, left VM requesting call back with our contact info.

## 2017-08-02 DIAGNOSIS — M1711 Unilateral primary osteoarthritis, right knee: Secondary | ICD-10-CM | POA: Diagnosis not present

## 2017-08-02 DIAGNOSIS — M25561 Pain in right knee: Secondary | ICD-10-CM | POA: Diagnosis not present

## 2017-08-10 ENCOUNTER — Other Ambulatory Visit: Payer: Self-pay | Admitting: Hematology and Oncology

## 2017-08-11 ENCOUNTER — Ambulatory Visit
Admission: RE | Admit: 2017-08-11 | Discharge: 2017-08-11 | Disposition: A | Discharge status: Home / Self Care | Payer: Medicare HMO | Source: Ambulatory Visit | Attending: Hematology and Oncology | Admitting: Hematology and Oncology

## 2017-08-11 DIAGNOSIS — Z1231 Encounter for screening mammogram for malignant neoplasm of breast: Secondary | ICD-10-CM

## 2017-09-12 DIAGNOSIS — F419 Anxiety disorder, unspecified: Secondary | ICD-10-CM | POA: Diagnosis not present

## 2017-09-12 DIAGNOSIS — Z6833 Body mass index (BMI) 33.0-33.9, adult: Secondary | ICD-10-CM | POA: Diagnosis not present

## 2017-09-12 DIAGNOSIS — I10 Essential (primary) hypertension: Secondary | ICD-10-CM | POA: Diagnosis not present

## 2017-09-12 DIAGNOSIS — L209 Atopic dermatitis, unspecified: Secondary | ICD-10-CM | POA: Diagnosis not present

## 2017-09-19 DIAGNOSIS — C50911 Malignant neoplasm of unspecified site of right female breast: Secondary | ICD-10-CM | POA: Diagnosis not present

## 2017-09-26 DIAGNOSIS — I2510 Atherosclerotic heart disease of native coronary artery without angina pectoris: Secondary | ICD-10-CM | POA: Diagnosis not present

## 2017-09-26 DIAGNOSIS — M1990 Unspecified osteoarthritis, unspecified site: Secondary | ICD-10-CM | POA: Diagnosis not present

## 2017-09-26 DIAGNOSIS — E7849 Other hyperlipidemia: Secondary | ICD-10-CM | POA: Diagnosis not present

## 2017-09-26 DIAGNOSIS — F3340 Major depressive disorder, recurrent, in remission, unspecified: Secondary | ICD-10-CM | POA: Diagnosis not present

## 2017-09-26 DIAGNOSIS — Z6834 Body mass index (BMI) 34.0-34.9, adult: Secondary | ICD-10-CM | POA: Diagnosis not present

## 2017-09-26 DIAGNOSIS — R7301 Impaired fasting glucose: Secondary | ICD-10-CM | POA: Diagnosis not present

## 2017-09-26 DIAGNOSIS — C50911 Malignant neoplasm of unspecified site of right female breast: Secondary | ICD-10-CM | POA: Diagnosis not present

## 2017-09-26 DIAGNOSIS — I10 Essential (primary) hypertension: Secondary | ICD-10-CM | POA: Diagnosis not present

## 2017-10-25 DIAGNOSIS — M25561 Pain in right knee: Secondary | ICD-10-CM | POA: Diagnosis not present

## 2017-10-25 DIAGNOSIS — M1711 Unilateral primary osteoarthritis, right knee: Secondary | ICD-10-CM | POA: Diagnosis not present

## 2017-11-15 DIAGNOSIS — I8312 Varicose veins of left lower extremity with inflammation: Secondary | ICD-10-CM | POA: Diagnosis not present

## 2017-11-15 DIAGNOSIS — Z85828 Personal history of other malignant neoplasm of skin: Secondary | ICD-10-CM | POA: Diagnosis not present

## 2017-11-15 DIAGNOSIS — L920 Granuloma annulare: Secondary | ICD-10-CM | POA: Diagnosis not present

## 2017-11-15 DIAGNOSIS — R21 Rash and other nonspecific skin eruption: Secondary | ICD-10-CM | POA: Diagnosis not present

## 2017-11-15 DIAGNOSIS — I872 Venous insufficiency (chronic) (peripheral): Secondary | ICD-10-CM | POA: Diagnosis not present

## 2017-11-15 DIAGNOSIS — I8311 Varicose veins of right lower extremity with inflammation: Secondary | ICD-10-CM | POA: Diagnosis not present

## 2017-11-18 DIAGNOSIS — Z23 Encounter for immunization: Secondary | ICD-10-CM | POA: Diagnosis not present

## 2017-11-28 DIAGNOSIS — L920 Granuloma annulare: Secondary | ICD-10-CM | POA: Diagnosis not present

## 2017-11-28 DIAGNOSIS — Z85828 Personal history of other malignant neoplasm of skin: Secondary | ICD-10-CM | POA: Diagnosis not present

## 2018-01-13 ENCOUNTER — Other Ambulatory Visit: Payer: Self-pay | Admitting: Hematology and Oncology

## 2018-02-09 DIAGNOSIS — M79605 Pain in left leg: Secondary | ICD-10-CM | POA: Diagnosis not present

## 2018-02-09 DIAGNOSIS — M25552 Pain in left hip: Secondary | ICD-10-CM | POA: Diagnosis not present

## 2018-02-09 DIAGNOSIS — M5136 Other intervertebral disc degeneration, lumbar region: Secondary | ICD-10-CM | POA: Diagnosis not present

## 2018-02-09 DIAGNOSIS — M25561 Pain in right knee: Secondary | ICD-10-CM | POA: Diagnosis not present

## 2018-02-09 DIAGNOSIS — M25562 Pain in left knee: Secondary | ICD-10-CM | POA: Diagnosis not present

## 2018-02-28 DIAGNOSIS — L920 Granuloma annulare: Secondary | ICD-10-CM | POA: Diagnosis not present

## 2018-02-28 DIAGNOSIS — L821 Other seborrheic keratosis: Secondary | ICD-10-CM | POA: Diagnosis not present

## 2018-02-28 DIAGNOSIS — Z85828 Personal history of other malignant neoplasm of skin: Secondary | ICD-10-CM | POA: Diagnosis not present

## 2018-04-04 DIAGNOSIS — E559 Vitamin D deficiency, unspecified: Secondary | ICD-10-CM | POA: Diagnosis not present

## 2018-04-04 DIAGNOSIS — R7301 Impaired fasting glucose: Secondary | ICD-10-CM | POA: Diagnosis not present

## 2018-04-04 DIAGNOSIS — R82998 Other abnormal findings in urine: Secondary | ICD-10-CM | POA: Diagnosis not present

## 2018-04-04 DIAGNOSIS — I10 Essential (primary) hypertension: Secondary | ICD-10-CM | POA: Diagnosis not present

## 2018-04-04 DIAGNOSIS — E7849 Other hyperlipidemia: Secondary | ICD-10-CM | POA: Diagnosis not present

## 2018-04-04 DIAGNOSIS — E038 Other specified hypothyroidism: Secondary | ICD-10-CM | POA: Diagnosis not present

## 2018-04-11 DIAGNOSIS — I10 Essential (primary) hypertension: Secondary | ICD-10-CM | POA: Diagnosis not present

## 2018-04-11 DIAGNOSIS — E038 Other specified hypothyroidism: Secondary | ICD-10-CM | POA: Diagnosis not present

## 2018-04-11 DIAGNOSIS — F3340 Major depressive disorder, recurrent, in remission, unspecified: Secondary | ICD-10-CM | POA: Diagnosis not present

## 2018-04-11 DIAGNOSIS — E7849 Other hyperlipidemia: Secondary | ICD-10-CM | POA: Diagnosis not present

## 2018-04-11 DIAGNOSIS — Z0000 Encounter for general adult medical examination without abnormal findings: Secondary | ICD-10-CM | POA: Diagnosis not present

## 2018-04-11 DIAGNOSIS — I2510 Atherosclerotic heart disease of native coronary artery without angina pectoris: Secondary | ICD-10-CM | POA: Diagnosis not present

## 2018-04-11 DIAGNOSIS — R7301 Impaired fasting glucose: Secondary | ICD-10-CM | POA: Diagnosis not present

## 2018-04-11 DIAGNOSIS — G4709 Other insomnia: Secondary | ICD-10-CM | POA: Diagnosis not present

## 2018-04-11 DIAGNOSIS — C50911 Malignant neoplasm of unspecified site of right female breast: Secondary | ICD-10-CM | POA: Diagnosis not present

## 2018-04-13 DIAGNOSIS — Z1212 Encounter for screening for malignant neoplasm of rectum: Secondary | ICD-10-CM | POA: Diagnosis not present

## 2018-06-21 NOTE — Assessment & Plan Note (Signed)
Rt Mastectomy 08/05/14:  IDC Grade 2, 1.2 cm T1cN0 (stage 1A); ER 100%, PR 86%, Ki-67 15%, HER-2 negative ratio 1.13 ILC Grade 2, 2.4 cm T2N0 (stage 2A); 0/4 LN Neg; ER 99%, PR 30%, Ki-67 5%  Current treatment: Anti-estrogen therapy with anastrozole 1 mg daily. (Started approximately 08/18/2014) (I did not recommend Oncotype DX testing because of her elderly age and low Ki-67 score)  Anastrozole toxicities: 1. Hot flashes have markedly improved 2. Musculoskeletal pains in the lower extremities: She needs to continue this for 1 more year.  3. Insomnia: This is been a lifelong problem. 4. Mild hair loss  Breast cancer surveillance: 1. Left breast mammograms  08/14/2017: Breast density category B, no findings suspicious for malignancy. 2. Breast exam 5/2/2019does not reveal any palpable lumps or nodules.  Return to clinic in 1 yearfor follow-up 

## 2018-06-22 ENCOUNTER — Telehealth: Payer: Self-pay | Admitting: Hematology and Oncology

## 2018-06-22 NOTE — Telephone Encounter (Signed)
Called patient regarding upcoming Webex appointment, patient does not have access for Webex nor a smart phone. This needs to be a telephone visit.

## 2018-06-25 NOTE — Progress Notes (Signed)
HEMATOLOGY-ONCOLOGY TELEPHONE VISIT PROGRESS NOTE  I connected with Leone C Tokarz on 06/26/2018 at  1:45 PM EDT by telephone and verified that I am speaking with the correct person using two identifiers.  I discussed the limitations, risks, security and privacy concerns of performing an evaluation and management service by telephone and the availability of in person appointments.  I also discussed with the patient that there may be a patient responsible charge related to this service. The patient expressed understanding and agreed to proceed.   History of Present Illness: [name] is a 84 y.o. female with above-mentioned history of right breast cancer treated with right mastectomy and is currently on antiestrogen therapy with anastrozole. I last saw her a year ago. Her most recent mammogram on 08/11/17 showed no evidence of malignancy bilaterally. She presents today over the phone for annual follow-up.     Breast cancer of lower-outer quadrant of right female breast ([place])   04/30/2014 Mammogram    Right breast: 4 x 3 x 25m oval nodule at 5:30-6:00, 5 cm from the nipple with adjacent calcifications    05/21/2014 Initial Biopsy    Right breast #1 at 7:00: Invasive lobular cancer ER+ (99%), PR+ (30%), Ki-67 5%, HER-2 negative (ratio 1.32), with LCIS. #2 at 6:00: Invasive ductal carcinoma ER+ (100%), PR+ (86%), Ki-67 15%, HER-2 negative (ratio 1.13)    05/26/2014 Breast MRI    Right breast: 1.4 x 1.5 x 1cm inferior mass correlating to 7:00; second clip artifact at 6:00; abnormal linear and focal enhancement extending from 7:00 mass toward nipple. 3.3 x 3.4 x 4.1cm area of enhancement asymmetric from left breast    05/26/2014 Clinical Stage    IDC: Stage IA (T1c N0 M0).  ILC: Stage 2A (T2 N0 M0)    06/02/2014 Procedure    Bx of right breast subaerolar region: LCIS    06/12/2014 Procedure    Genetic testing Ova Next gene panel (Cephus Shelling revealed no clinically significant variants for ATM, BARD1,  BRCA1, BRCA2, BRIP1, CDH1, CHEK2, EPCAM, MLH1, MRE11A, MSH2, MSH6, MUTYH, NBN, NF1, PALB2, PMS2, PTEN, RAD50, RAD51C, RAD51D, SMARCA4, STK11, and TP53.    08/05/2014 Surgery    Right Mastectomy with SLNB ([facility]): IDC, grade 2, spanning 1.2 cm. ER+ (100%), PR+ (86%), HER2 neu negative (ratio 1.13), Ki-67 15%.  ILC, grade 2, spanning 2.4 cm, 0/4 LN Neg; ER+ (99%), PR+ (30%), HER2 negative (ratio 1.34)    08/05/2014 Pathologic Stage    IDC: Stage IA (T1c N0 M0).  ILC: Stage 2A (T2 N0 M0)    08/18/2014 -  Anti-estrogen oral therapy    Anastrozole 1 mg daily (Barry Faircloth). Planned duration of treatment: 5 years.      10/31/2014 Survivorship    A copy of the survivorship care plan was mailed to the patient in lieu of an in-person visit at her request.     [place] breast [place] ([place])   08/05/2014 Initial Diagnosis    Cancer of right breast     Observations/Objective:   Assessment Plan:  Breast cancer of lower-outer quadrant of right female breast [place]t [place] 08/05/14:  IDC Grade 2, 1.2 cm T1cN0 (stage 1A); ER 100%, PR 86%, Ki-67 15%, HER-2 negative ratio 1.13 ILC Grade 2, 2.4 cm T2N0 (stage 2A); 0/4 LN Neg; ER 99%, PR 30%, Ki-67 5%  Current treatment: Anti-estrogen therapy with anastrozole 1 mg daily. (Started approximately 08/18/2014), plan for 5 years. (I did not recommend Oncotype DX testing because of her elderly age  and low Ki-67 score)  Anastrozole toxicities: 1. Hot flashes have markedly improved 2. Musculoskeletal pains in the lower extremities: 3. Insomnia: This is been a lifelong problem. 4. Mild hair loss  She needs to continue this for 1 more year. Rash: granuloma annulare  Breast cancer surveillance: 1. Left breast mammograms  08/14/2017: Breast density category B, no findings suspicious for malignancy. 2. Breast exam 5/2/2019does not reveal any palpable lumps or nodules.  Return to clinic in 1 yearfor follow-up  I discussed the assessment  and treatment plan with the patient. The patient was provided an opportunity to ask questions and all were answered. The patient agreed with the plan and demonstrated an understanding of the instructions. The patient was advised to call back or seek an in-person evaluation if the symptoms worsen or if the condition fails to improve as anticipated.   I provided 15 minutes of non-face-to-face time during this encounter.   Rulon Eisenmenger, MD 06/26/2018    I, Molly Dorshimer, am acting as scribe for Nicholas Lose, MD.  I have reviewed the above documentation for accuracy and completeness, and I agree with the above.

## 2018-06-26 ENCOUNTER — Inpatient Hospital Stay: Payer: Medicare HMO | Attending: Hematology and Oncology | Admitting: Hematology and Oncology

## 2018-06-26 DIAGNOSIS — C50511 Malignant neoplasm of lower-outer quadrant of right female breast: Secondary | ICD-10-CM

## 2018-06-26 DIAGNOSIS — Z170 Estrogen receptor positive status [ER+]: Secondary | ICD-10-CM | POA: Diagnosis not present

## 2018-06-26 DIAGNOSIS — Z79811 Long term (current) use of aromatase inhibitors: Secondary | ICD-10-CM | POA: Diagnosis not present

## 2018-06-26 MED ORDER — ANASTROZOLE 1 MG PO TABS: ORAL_TABLET | ORAL | 3 refills | Status: DC

## 2018-06-27 ENCOUNTER — Telehealth: Payer: Self-pay | Admitting: Hematology and Oncology

## 2018-06-27 NOTE — Telephone Encounter (Signed)
Tried to reach regarding schedule °

## 2018-10-03 ENCOUNTER — Other Ambulatory Visit: Payer: Self-pay | Admitting: Hematology and Oncology

## 2018-10-09 DIAGNOSIS — M1990 Unspecified osteoarthritis, unspecified site: Secondary | ICD-10-CM | POA: Diagnosis not present

## 2018-10-09 DIAGNOSIS — I10 Essential (primary) hypertension: Secondary | ICD-10-CM | POA: Diagnosis not present

## 2018-10-09 DIAGNOSIS — F3340 Major depressive disorder, recurrent, in remission, unspecified: Secondary | ICD-10-CM | POA: Diagnosis not present

## 2018-10-09 DIAGNOSIS — R7301 Impaired fasting glucose: Secondary | ICD-10-CM | POA: Diagnosis not present

## 2018-10-09 DIAGNOSIS — E785 Hyperlipidemia, unspecified: Secondary | ICD-10-CM | POA: Diagnosis not present

## 2018-10-09 DIAGNOSIS — C50911 Malignant neoplasm of unspecified site of right female breast: Secondary | ICD-10-CM | POA: Diagnosis not present

## 2018-10-09 DIAGNOSIS — I2510 Atherosclerotic heart disease of native coronary artery without angina pectoris: Secondary | ICD-10-CM | POA: Diagnosis not present

## 2018-10-11 ENCOUNTER — Other Ambulatory Visit: Payer: Self-pay | Admitting: Hematology and Oncology

## 2018-10-11 DIAGNOSIS — Z1231 Encounter for screening mammogram for malignant neoplasm of breast: Secondary | ICD-10-CM

## 2018-10-18 DIAGNOSIS — M8589 Other specified disorders of bone density and structure, multiple sites: Secondary | ICD-10-CM | POA: Diagnosis not present

## 2018-10-19 DIAGNOSIS — Z23 Encounter for immunization: Secondary | ICD-10-CM | POA: Diagnosis not present

## 2018-10-19 DIAGNOSIS — C50911 Malignant neoplasm of unspecified site of right female breast: Secondary | ICD-10-CM | POA: Diagnosis not present

## 2018-11-23 ENCOUNTER — Ambulatory Visit
Admission: RE | Admit: 2018-11-23 | Discharge: 2018-11-23 | Disposition: A | Discharge status: Home / Self Care | Payer: Medicare HMO | Source: Ambulatory Visit | Attending: Hematology and Oncology | Admitting: Hematology and Oncology

## 2018-11-23 ENCOUNTER — Other Ambulatory Visit: Payer: Self-pay

## 2018-11-23 DIAGNOSIS — Z1231 Encounter for screening mammogram for malignant neoplasm of breast: Secondary | ICD-10-CM | POA: Diagnosis not present

## 2019-03-10 ENCOUNTER — Ambulatory Visit: Payer: Medicare HMO | Attending: Internal Medicine

## 2019-03-10 DIAGNOSIS — Z23 Encounter for immunization: Secondary | ICD-10-CM | POA: Insufficient documentation

## 2019-03-10 NOTE — Progress Notes (Signed)
   Covid-19 Vaccination Clinic  Name:  [name]    MRN: [id number] DOB: [birth year]  03/10/2019  [name] was observed post Covid-19 immunization for 15 minutes without incidence. She was provided with Vaccine Information Sheet and instruction to access the V-Safe system.   [name] was instructed to call 911 with any severe reactions post vaccine: Marland Kitchen Difficulty breathing  . Swelling of your face and throat  . A fast heartbeat  . A bad rash all over your body  . Dizziness and weakness   1:00

## 2019-03-31 ENCOUNTER — Ambulatory Visit: Payer: Medicare HMO | Attending: Internal Medicine

## 2019-03-31 ENCOUNTER — Other Ambulatory Visit: Payer: Self-pay | Admitting: Hematology and Oncology

## 2019-03-31 DIAGNOSIS — Z23 Encounter for immunization: Secondary | ICD-10-CM | POA: Insufficient documentation

## 2019-03-31 NOTE — Progress Notes (Signed)
   Covid-19 Vaccination Clinic  Name:  Cleophas Gaydon    MRN: [id number] DOB: 1934/09/17  03/31/2019  Ms. Sprigg was observed post Covid-19 immunization for 15 minutes without incidence. She was provided with Vaccine Information Sheet and instruction to access the V-Safe system.   Ms. Terranova was instructed to call 911 with any severe reactions post vaccine: Marland Kitchen Difficulty breathing  . Swelling of your face and throat  . A fast heartbeat  . A bad rash all over your body  . Dizziness and weakness    Immunizations Administered    Name Date Dose VIS Date Route   Pfizer COVID-19 Vaccine 03/31/2019  1:16 PM 0.3 mL 02/01/2019 Intramuscular   Manufacturer: [place]   Lot: [id number]   [place]: [id number]

## 2019-04-10 DIAGNOSIS — E038 Other specified hypothyroidism: Secondary | ICD-10-CM | POA: Diagnosis not present

## 2019-04-10 DIAGNOSIS — E7849 Other hyperlipidemia: Secondary | ICD-10-CM | POA: Diagnosis not present

## 2019-04-10 DIAGNOSIS — M859 Disorder of bone density and structure, unspecified: Secondary | ICD-10-CM | POA: Diagnosis not present

## 2019-04-10 DIAGNOSIS — R7301 Impaired fasting glucose: Secondary | ICD-10-CM | POA: Diagnosis not present

## 2019-04-10 DIAGNOSIS — Z0000 Encounter for general adult medical examination without abnormal findings: Secondary | ICD-10-CM | POA: Diagnosis not present

## 2019-04-16 DIAGNOSIS — R82998 Other abnormal findings in urine: Secondary | ICD-10-CM | POA: Diagnosis not present

## 2019-04-16 DIAGNOSIS — I10 Essential (primary) hypertension: Secondary | ICD-10-CM | POA: Diagnosis not present

## 2019-04-17 DIAGNOSIS — C50911 Malignant neoplasm of unspecified site of right female breast: Secondary | ICD-10-CM | POA: Diagnosis not present

## 2019-04-17 DIAGNOSIS — I2510 Atherosclerotic heart disease of native coronary artery without angina pectoris: Secondary | ICD-10-CM | POA: Diagnosis not present

## 2019-04-17 DIAGNOSIS — R7301 Impaired fasting glucose: Secondary | ICD-10-CM | POA: Diagnosis not present

## 2019-04-17 DIAGNOSIS — Z800 Family history of malignant neoplasm of digestive organs: Secondary | ICD-10-CM | POA: Diagnosis not present

## 2019-04-17 DIAGNOSIS — Z1212 Encounter for screening for malignant neoplasm of rectum: Secondary | ICD-10-CM | POA: Diagnosis not present

## 2019-04-17 DIAGNOSIS — E785 Hyperlipidemia, unspecified: Secondary | ICD-10-CM | POA: Diagnosis not present

## 2019-04-17 DIAGNOSIS — F3340 Major depressive disorder, recurrent, in remission, unspecified: Secondary | ICD-10-CM | POA: Diagnosis not present

## 2019-04-17 DIAGNOSIS — I10 Essential (primary) hypertension: Secondary | ICD-10-CM | POA: Diagnosis not present

## 2019-04-17 DIAGNOSIS — Z0000 Encounter for general adult medical examination without abnormal findings: Secondary | ICD-10-CM | POA: Diagnosis not present

## 2019-04-17 DIAGNOSIS — G4700 Insomnia, unspecified: Secondary | ICD-10-CM | POA: Diagnosis not present

## 2019-05-15 DIAGNOSIS — C44311 Basal cell carcinoma of skin of nose: Secondary | ICD-10-CM | POA: Diagnosis not present

## 2019-05-15 DIAGNOSIS — C44319 Basal cell carcinoma of skin of other parts of face: Secondary | ICD-10-CM | POA: Diagnosis not present

## 2019-05-15 DIAGNOSIS — D485 Neoplasm of uncertain behavior of skin: Secondary | ICD-10-CM | POA: Diagnosis not present

## 2019-05-15 DIAGNOSIS — D2261 Melanocytic nevi of right upper limb, including shoulder: Secondary | ICD-10-CM | POA: Diagnosis not present

## 2019-05-15 DIAGNOSIS — Z85828 Personal history of other malignant neoplasm of skin: Secondary | ICD-10-CM | POA: Diagnosis not present

## 2019-05-15 DIAGNOSIS — L821 Other seborrheic keratosis: Secondary | ICD-10-CM | POA: Diagnosis not present

## 2019-05-15 DIAGNOSIS — D1801 Hemangioma of skin and subcutaneous tissue: Secondary | ICD-10-CM | POA: Diagnosis not present

## 2019-05-20 DIAGNOSIS — H5203 Hypermetropia, bilateral: Secondary | ICD-10-CM | POA: Diagnosis not present

## 2019-06-11 DIAGNOSIS — C44319 Basal cell carcinoma of skin of other parts of face: Secondary | ICD-10-CM | POA: Diagnosis not present

## 2019-06-11 DIAGNOSIS — C44311 Basal cell carcinoma of skin of nose: Secondary | ICD-10-CM | POA: Diagnosis not present

## 2019-06-11 DIAGNOSIS — Z85828 Personal history of other malignant neoplasm of skin: Secondary | ICD-10-CM | POA: Diagnosis not present

## 2019-06-25 NOTE — Progress Notes (Signed)
Patient Care Team: Prince Solian, MD as PCP - General (Internal Medicine) Excell Seltzer, MD (Inactive) as Consulting Physician (General Surgery) Nicholas Lose, MD as Consulting Physician (Hematology and Oncology) Sylvan Cheese, NP as Nurse Practitioner (Nurse Practitioner)  DIAGNOSIS:    ICD-10-CM   1. Malignant neoplasm of lower-outer quadrant of right breast of female, estrogen receptor positive ([place])  C50.511    Z17.0     SUMMARY OF ONCOLOGIC HISTORY: Oncology History  Breast cancer of lower-outer quadrant of right female breast ([place])  04/30/2014 Mammogram   Right breast: 4 x 3 x 39m oval nodule at 5:30-6:00, 5 cm from the nipple with adjacent calcifications   05/21/2014 Initial Biopsy   Right breast #1 at 7:00: Invasive lobular cancer ER+ (99%), PR+ (30%), Ki-67 5%, HER-2 negative (ratio 1.32), with LCIS. #2 at 6:00: Invasive ductal carcinoma ER+ (100%), PR+ (86%), Ki-67 15%, HER-2 negative (ratio 1.13)   05/26/2014 Breast MRI   Right breast: 1.4 x 1.5 x 1cm inferior mass correlating to 7:00; second clip artifact at 6:00; abnormal linear and focal enhancement extending from 7:00 mass toward nipple. 3.3 x 3.4 x 4.1cm area of enhancement asymmetric from left breast   05/26/2014 Clinical Stage   IDC: Stage IA (T1c N0 M0).  ILC: Stage 2A (T2 N0 M0)   06/02/2014 Procedure   Bx of right breast subaerolar region: LCIS   06/12/2014 Procedure   Genetic testing Ova Next gene panel (Cephus Shelling revealed no clinically significant variants for ATM, BARD1, BRCA1, BRCA2, BRIP1, CDH1, CHEK2, EPCAM, MLH1, MRE11A, MSH2, MSH6, MUTYH, NBN, NF1, PALB2, PMS2, PTEN, RAD50, RAD51C, RAD51D, SMARCA4, STK11, and TP53.   08/05/2014 Surgery   Right Mastectomy with SLNB ([facility]): IDC, grade 2, spanning 1.2 cm. ER+ (100%), PR+ (86%), HER2 neu negative (ratio 1.13), Ki-67 15%.  ILC, grade 2, spanning 2.4 cm, 0/4 LN Neg; ER+ (99%), PR+ (30%), HER2 negative (ratio 1.34)   08/05/2014 Pathologic Stage     IDC: Stage IA (T1c N0 M0).  ILC: Stage 2A (T2 N0 M0)   08/18/2014 -  Anti-estrogen oral therapy   Anastrozole 1 mg daily ([place]). Planned duration of treatment: 5 years.     10/31/2014 Survivorship   A copy of the survivorship care plan was mailed to the patient in lieu of an in-person visit at her request.   [place]  08/05/2014 Initial Diagnosis   Cancer of right breast     CHIEF COMPLIANT: Follow-up of right breast cancer on anastrozole  INTERVAL HISTORY: Jasmine Belfieldis a 85y.o. with above-mentioned history of right breast cancer treated with right mastectomy and is currently on antiestrogen therapy with anastrozole. Mammogram on 11/23/18 showed no evidence of malignancy bilaterally. She presents to the clinic today for annual follow-up.   She denies any pain lumps or nodules in the breast.  ALLERGIES:  is allergic to iodine and sulfa antibiotics.  MEDICATIONS:  Current Outpatient Medications  Medication Sig Dispense Refill  . acetaminophen (TYLENOL) 500 MG tablet Take 500 mg by mouth every 6 (six) hours as needed for mild pain or headache.    . ALPRAZolam (XANAX) 0.5 MG tablet Take 0.5 mg by mouth 3 (three) times daily as needed for sleep or anxiety.    .Marland Kitchenanastrozole (ARIMIDEX) 1 MG tablet TAKE 1 TABLET (1 MG TOTAL) BY MOUTH DAILY. 90 tablet 3  . aspirin EC 81 MG tablet Take 81 mg by mouth daily.    . clobetasol cream (TEMOVATE) 0.05 %  Apply 1 application topically 2 (two) times daily.    . diclofenac sodium (VOLTAREN) 1 % GEL Apply topically 4 (four) times daily.    . irbesartan (AVAPRO) 75 MG tablet Take 75 mg by mouth daily.    Marland Kitchen levothyroxine (SYNTHROID, LEVOTHROID) 50 MCG tablet Take 50 mcg by mouth every morning.    . meclizine (ANTIVERT) 25 MG tablet TAKE 1 TABLET BY MOUTH EVERY 6 HOURS FOR VERTIGO  2  . meloxicam (MOBIC) 15 MG tablet Take 15 mg by mouth daily.    . metoprolol succinate (TOPROL-XL) 100 MG 24 hr tablet Take 100 mg  by mouth daily. Take with or immediately following a meal.    . Multiple Vitamin (MULTIVITAMIN WITH MINERALS) TABS Take 1 tablet by mouth daily.    Marland Kitchen omeprazole (PRILOSEC) 20 MG capsule Take 20 mg by mouth daily.    Dellia Nims CALCIUM + D3 500-200 MG-UNIT TABS TAKE 1 TABLET BY MOUTH EVERY DAY WITH BREAKFAST 160 tablet 1   No current facility-administered medications for this visit.    PHYSICAL EXAMINATION: ECOG PERFORMANCE STATUS: 1 - Symptomatic but completely ambulatory  Vitals:   06/26/19 1357  BP: (!) 146/86  Pulse: 75  Resp: 17  Temp: 98.5 F (36.9 C)  SpO2: 96%   Filed Weights   06/26/19 1357  Weight: 172 lb 9.6 oz (78.3 kg)    BREAST: No palpable masses or nodules in either right or left breasts. No palpable axillary supraclavicular or infraclavicular adenopathy no breast tenderness or nipple discharge. (exam performed in the presence of a chaperone)  LABORATORY DATA:  I have reviewed the data as listed CMP Latest Ref Rng & Units 07/31/2014 07/30/2014 07/30/2014  Glucose 65 - 99 mg/dL 144(H) 106(H) 110(H)  BUN 6 - 20 mg/dL _0 Creatinine 0.44 - 1.00 mg/dL 0.80 0.80 0.80  Sodium 135 - 145 mmol/L 138 139 139  Potassium 3.5 - 5.1 mmol/L 4.8 4.3 4.4  Chloride 101 - 111 mmol/L 105 104 106  CO2 22 - 32 mmol/L 23 - 26  Calcium 8.9 - 10.3 mg/dL 8.8(L) - 9.1  Total Protein 6.5 - 8.1 g/dL 6.5 - 6.4(L)  Total Bilirubin 0.3 - 1.2 mg/dL 0.4 - 0.4  Alkaline Phos 38 - 126 U/L 56 - 60  AST 15 - 41 U/L 20 - 24  ALT 14 - 54 U/L 18 - 20    Lab Results  Component Value Date   WBC 11.1 (H) 08/06/2014   HGB 11.7 (L) 08/06/2014   HCT 35.2 (L) 08/06/2014   MCV 91.4 08/06/2014   PLT 177 08/06/2014   NEUTROABS 5.2 07/30/2014    ASSESSMENT & PLAN:  Breast cancer of lower-outer quadrant of right female breast ([place]) Rt Mastectomy 08/05/14:  IDC Grade 2, 1.2 cm T1cN0 (stage 1A); ER 100%, PR 86%, Ki-67 15%, HER-2 negative ratio 1.13 ILC Grade 2, 2.4 cm T2N0 (stage 2A); 0/4 LN Neg; ER  99%, PR 30%, Ki-67 5%  Current treatment: Anti-estrogen therapy with anastrozole 1 mg daily. (Started approximately 08/18/2014), plan for 5 years. (I did not recommend Oncotype DX testing because of her elderly age and low Ki-67 score)  Anastrozole toxicities: 1. Hot flashes have markedly improved 2. Musculoskeletal pains in the lowerextremities: 3. Insomnia:This is been a lifelong problem. 4. Mild hair loss  Patient completed 5 years of antiestrogen therapy and can discontinue the tablets. However she thinks that the antiestrogen therapy is preventing her vertigo.  She is anxious about stopping it.  If her vertigo returns back then we can reinitiate antiestrogen therapy if she wishes to do so. Rash: granuloma annulare Her husband passed away 5 months ago and she is still very sad and grieving about it.  Breast cancer surveillance: 1. Left breast mammograms 08/14/2017: Breast density category B, no findings suspicious for malignancy. 2. Breast exam5/2/2019does not reveal any palpable lumps or nodules.  Return to clinic in 1 yearfor follow-up    No orders of the defined types were placed in this encounter.  The patient has a good understanding of the overall plan. she agrees with it. she will call with any problems that may develop before the next visit here.  Total time spent: 20 mins including face to face time and time spent for planning, charting and coordination of care  Nicholas Lose, MD 06/26/2019  I, Cloyde Reams Dorshimer, am acting as scribe for Dr. Nicholas Lose.  I have reviewed the above documentation for accuracy and completeness, and I agree with the above.

## 2019-06-26 ENCOUNTER — Inpatient Hospital Stay: Payer: Medicare HMO | Attending: Hematology and Oncology | Admitting: Hematology and Oncology

## 2019-06-26 ENCOUNTER — Other Ambulatory Visit: Payer: Self-pay

## 2019-06-26 DIAGNOSIS — R232 Flushing: Secondary | ICD-10-CM | POA: Insufficient documentation

## 2019-06-26 DIAGNOSIS — G4700 Insomnia, unspecified: Secondary | ICD-10-CM | POA: Diagnosis not present

## 2019-06-26 DIAGNOSIS — Z7982 Long term (current) use of aspirin: Secondary | ICD-10-CM | POA: Insufficient documentation

## 2019-06-26 DIAGNOSIS — Z79811 Long term (current) use of aromatase inhibitors: Secondary | ICD-10-CM | POA: Diagnosis not present

## 2019-06-26 DIAGNOSIS — Z170 Estrogen receptor positive status [ER+]: Secondary | ICD-10-CM | POA: Insufficient documentation

## 2019-06-26 DIAGNOSIS — Z79899 Other long term (current) drug therapy: Secondary | ICD-10-CM | POA: Insufficient documentation

## 2019-06-26 DIAGNOSIS — Z9011 Acquired absence of right breast and nipple: Secondary | ICD-10-CM | POA: Diagnosis not present

## 2019-06-26 DIAGNOSIS — R21 Rash and other nonspecific skin eruption: Secondary | ICD-10-CM | POA: Insufficient documentation

## 2019-06-26 DIAGNOSIS — C50511 Malignant neoplasm of lower-outer quadrant of right female breast: Secondary | ICD-10-CM | POA: Diagnosis not present

## 2019-06-26 NOTE — Assessment & Plan Note (Signed)
Rt Mastectomy 08/05/14:  IDC Grade 2, 1.2 cm T1cN0 (stage 1A); ER 100%, PR 86%, Ki-67 15%, HER-2 negative ratio 1.13 ILC Grade 2, 2.4 cm T2N0 (stage 2A); 0/4 LN Neg; ER 99%, PR 30%, Ki-67 5%  Current treatment: Anti-estrogen therapy with anastrozole 1 mg daily. (Started approximately 08/18/2014), plan for 5 years. (I did not recommend Oncotype DX testing because of her elderly age and low Ki-67 score)  Anastrozole toxicities: 1. Hot flashes have markedly improved 2. Musculoskeletal pains in the lowerextremities: 3. Insomnia:This is been a lifelong problem. 4. Mild hair loss  She needs to continue this for 1 more year. Rash: granuloma annulare  Breast cancer surveillance: 1. Left breast mammograms 08/14/2017: Breast density category B, no findings suspicious for malignancy. 2. Breast exam5/2/2019does not reveal any palpable lumps or nodules.  Return to clinic in 1 yearfor follow-up

## 2019-06-27 ENCOUNTER — Telehealth: Payer: Self-pay | Admitting: Hematology and Oncology

## 2019-06-27 ENCOUNTER — Encounter: Payer: Self-pay | Admitting: Hematology and Oncology

## 2019-06-27 NOTE — Telephone Encounter (Signed)
Scheduled per 05/06 los, patient has been called and voicemail was left.

## 2019-08-08 ENCOUNTER — Other Ambulatory Visit: Payer: Self-pay | Admitting: Hematology and Oncology

## 2019-10-30 DIAGNOSIS — E039 Hypothyroidism, unspecified: Secondary | ICD-10-CM | POA: Diagnosis not present

## 2019-10-30 DIAGNOSIS — E785 Hyperlipidemia, unspecified: Secondary | ICD-10-CM | POA: Diagnosis not present

## 2019-10-30 DIAGNOSIS — C50911 Malignant neoplasm of unspecified site of right female breast: Secondary | ICD-10-CM | POA: Diagnosis not present

## 2019-10-30 DIAGNOSIS — G4700 Insomnia, unspecified: Secondary | ICD-10-CM | POA: Diagnosis not present

## 2019-10-30 DIAGNOSIS — F3340 Major depressive disorder, recurrent, in remission, unspecified: Secondary | ICD-10-CM | POA: Diagnosis not present

## 2019-10-30 DIAGNOSIS — I10 Essential (primary) hypertension: Secondary | ICD-10-CM | POA: Diagnosis not present

## 2019-10-30 DIAGNOSIS — N1831 Chronic kidney disease, stage 3a: Secondary | ICD-10-CM | POA: Diagnosis not present

## 2019-10-30 DIAGNOSIS — I2510 Atherosclerotic heart disease of native coronary artery without angina pectoris: Secondary | ICD-10-CM | POA: Diagnosis not present

## 2019-10-30 DIAGNOSIS — R7301 Impaired fasting glucose: Secondary | ICD-10-CM | POA: Diagnosis not present

## 2019-10-31 ENCOUNTER — Other Ambulatory Visit: Payer: Self-pay | Admitting: Hematology and Oncology

## 2019-10-31 DIAGNOSIS — Z1231 Encounter for screening mammogram for malignant neoplasm of breast: Secondary | ICD-10-CM

## 2019-11-23 DIAGNOSIS — Z23 Encounter for immunization: Secondary | ICD-10-CM | POA: Diagnosis not present

## 2019-12-17 ENCOUNTER — Ambulatory Visit: Payer: Medicare HMO

## 2020-01-27 ENCOUNTER — Ambulatory Visit
Admission: RE | Admit: 2020-01-27 | Discharge: 2020-01-27 | Disposition: A | Discharge status: Home / Self Care | Payer: Medicare HMO | Source: Ambulatory Visit | Attending: Hematology and Oncology | Admitting: Hematology and Oncology

## 2020-01-27 ENCOUNTER — Other Ambulatory Visit: Payer: Self-pay

## 2020-01-27 DIAGNOSIS — Z1231 Encounter for screening mammogram for malignant neoplasm of breast: Secondary | ICD-10-CM

## 2020-04-03 ENCOUNTER — Other Ambulatory Visit: Payer: Self-pay | Admitting: Hematology and Oncology

## 2020-04-10 DIAGNOSIS — E785 Hyperlipidemia, unspecified: Secondary | ICD-10-CM | POA: Diagnosis not present

## 2020-04-10 DIAGNOSIS — M859 Disorder of bone density and structure, unspecified: Secondary | ICD-10-CM | POA: Diagnosis not present

## 2020-04-10 DIAGNOSIS — E039 Hypothyroidism, unspecified: Secondary | ICD-10-CM | POA: Diagnosis not present

## 2020-04-10 DIAGNOSIS — R7301 Impaired fasting glucose: Secondary | ICD-10-CM | POA: Diagnosis not present

## 2020-04-14 DIAGNOSIS — I129 Hypertensive chronic kidney disease with stage 1 through stage 4 chronic kidney disease, or unspecified chronic kidney disease: Secondary | ICD-10-CM | POA: Diagnosis not present

## 2020-04-14 DIAGNOSIS — E785 Hyperlipidemia, unspecified: Secondary | ICD-10-CM | POA: Diagnosis not present

## 2020-04-14 DIAGNOSIS — I10 Essential (primary) hypertension: Secondary | ICD-10-CM | POA: Diagnosis not present

## 2020-04-14 DIAGNOSIS — R7301 Impaired fasting glucose: Secondary | ICD-10-CM | POA: Diagnosis not present

## 2020-04-14 DIAGNOSIS — R82998 Other abnormal findings in urine: Secondary | ICD-10-CM | POA: Diagnosis not present

## 2020-04-14 DIAGNOSIS — G4700 Insomnia, unspecified: Secondary | ICD-10-CM | POA: Diagnosis not present

## 2020-04-14 DIAGNOSIS — F3340 Major depressive disorder, recurrent, in remission, unspecified: Secondary | ICD-10-CM | POA: Diagnosis not present

## 2020-04-14 DIAGNOSIS — I2510 Atherosclerotic heart disease of native coronary artery without angina pectoris: Secondary | ICD-10-CM | POA: Diagnosis not present

## 2020-04-14 DIAGNOSIS — Z0000 Encounter for general adult medical examination without abnormal findings: Secondary | ICD-10-CM | POA: Diagnosis not present

## 2020-04-14 DIAGNOSIS — E039 Hypothyroidism, unspecified: Secondary | ICD-10-CM | POA: Diagnosis not present

## 2020-04-14 DIAGNOSIS — N1831 Chronic kidney disease, stage 3a: Secondary | ICD-10-CM | POA: Diagnosis not present

## 2020-06-09 DIAGNOSIS — I8312 Varicose veins of left lower extremity with inflammation: Secondary | ICD-10-CM | POA: Diagnosis not present

## 2020-06-09 DIAGNOSIS — Z85828 Personal history of other malignant neoplasm of skin: Secondary | ICD-10-CM | POA: Diagnosis not present

## 2020-06-09 DIAGNOSIS — L218 Other seborrheic dermatitis: Secondary | ICD-10-CM | POA: Diagnosis not present

## 2020-06-09 DIAGNOSIS — L720 Epidermal cyst: Secondary | ICD-10-CM | POA: Diagnosis not present

## 2020-06-09 DIAGNOSIS — D2261 Melanocytic nevi of right upper limb, including shoulder: Secondary | ICD-10-CM | POA: Diagnosis not present

## 2020-06-09 DIAGNOSIS — L918 Other hypertrophic disorders of the skin: Secondary | ICD-10-CM | POA: Diagnosis not present

## 2020-06-09 DIAGNOSIS — D2272 Melanocytic nevi of left lower limb, including hip: Secondary | ICD-10-CM | POA: Diagnosis not present

## 2020-06-09 DIAGNOSIS — I8311 Varicose veins of right lower extremity with inflammation: Secondary | ICD-10-CM | POA: Diagnosis not present

## 2020-06-09 DIAGNOSIS — C44319 Basal cell carcinoma of skin of other parts of face: Secondary | ICD-10-CM | POA: Diagnosis not present

## 2020-06-09 DIAGNOSIS — I872 Venous insufficiency (chronic) (peripheral): Secondary | ICD-10-CM | POA: Diagnosis not present

## 2020-06-22 ENCOUNTER — Telehealth: Payer: Self-pay | Admitting: Hematology and Oncology

## 2020-06-22 NOTE — Telephone Encounter (Signed)
R/s appt per 5/2 sch msg. Pt aware.  

## 2020-06-25 ENCOUNTER — Ambulatory Visit: Payer: Medicare HMO | Admitting: Hematology and Oncology

## 2020-06-29 NOTE — Progress Notes (Signed)
Patient Care Team: Prince Solian, MD as PCP - General (Internal Medicine) Excell Seltzer, MD (Inactive) as Consulting Physician (General Surgery) Nicholas Lose, MD as Consulting Physician (Hematology and Oncology) Sylvan Cheese, NP as Nurse Practitioner (Nurse Practitioner)  DIAGNOSIS:    ICD-10-CM   1. Malignant neoplasm of lower-outer quadrant of right breast of female, estrogen receptor positive ([place])  C50.511    Z17.0     SUMMARY OF ONCOLOGIC HISTORY: Oncology History  Breast cancer of lower-outer quadrant of right female breast ([place])  04/30/2014 Mammogram   Right breast: 4 x 3 x 82m oval nodule at 5:30-6:00, 5 cm from the nipple with adjacent calcifications   05/21/2014 Initial Biopsy   Right breast #1 at 7:00: Invasive lobular cancer ER+ (99%), PR+ (30%), Ki-67 5%, HER-2 negative (ratio 1.32), with LCIS. #2 at 6:00: Invasive ductal carcinoma ER+ (100%), PR+ (86%), Ki-67 15%, HER-2 negative (ratio 1.13)   05/26/2014 Breast MRI   Right breast: 1.4 x 1.5 x 1cm inferior mass correlating to 7:00; second clip artifact at 6:00; abnormal linear and focal enhancement extending from 7:00 mass toward nipple. 3.3 x 3.4 x 4.1cm area of enhancement asymmetric from left breast   05/26/2014 Clinical Stage   IDC: Stage IA (T1c N0 M0).  ILC: Stage 2A (T2 N0 M0)   06/02/2014 Procedure   Bx of right breast subaerolar region: LCIS   06/12/2014 Procedure   Genetic testing Ova Next gene panel ([facility] revealed no clinically significant variants for ATM, BARD1, BRCA1, BRCA2, BRIP1, CDH1, CHEK2, EPCAM, MLH1, MRE11A, MSH2, MSH6, MUTYH, NBN, NF1, PALB2, PMS2, PTEN, RAD50, RAD51C, RAD51D, SMARCA4, STK11, and TP53.   08/05/2014 Surgery   Right Mastectomy with SLNB ([facility]): IDC, grade 2, spanning 1.2 cm. ER+ (100%), PR+ (86%), HER2 neu negative (ratio 1.13), Ki-67 15%.  ILC, grade 2, spanning 2.4 cm, 0/4 LN Neg; ER+ (99%), PR+ (30%), HER2 negative (ratio 1.34)   08/05/2014 Pathologic Stage    IDC: Stage IA (T1c N0 M0).  ILC: Stage 2A (T2 N0 M0)   08/18/2014 -  Anti-estrogen oral therapy   Anastrozole 1 mg daily ([place]). Planned duration of treatment: 5 years.     10/31/2014 Survivorship   A copy of the survivorship care plan was mailed to the patient in lieu of an in-person visit at her request.   [place] breast [place]  08/05/2014 Initial Diagnosis   Cancer of right breast     CHIEF COMPLIANT: Follow-up of right breast cancer on anastrozole  INTERVAL HISTORY: [name] a 86y.o. with above-mentioned history of right breast cancer treated with right mastectomy and is currently on antiestrogen therapy with anastrozole. Mammogram on 01/27/20 showed no evidence of malignancy bilaterally. She presents to the clinic today for annual follow-up.  Her major complaint today is imbalance issues.  ALLERGIES:  is allergic to iodine and sulfa antibiotics.  MEDICATIONS:  Current Outpatient Medications  Medication Sig Dispense Refill  . acetaminophen (TYLENOL) 500 MG tablet Take 500 mg by mouth every 6 (six) hours as needed for mild pain or headache.    . ALPRAZolam (XANAX) 0.5 MG tablet Take 0.5 mg by mouth 3 (three) times daily as needed for sleep or anxiety.    .Marland Kitchenaspirin EC 81 MG tablet Take 81 mg by mouth daily.    . Calcium Carb-Cholecalciferol (OYSTER SHELL CALCIUM W/D) 500-200 MG-UNIT TABS TAKE 1 TABLET BY MOUTH EVERY DAY WITH BREAKFAST 90 tablet 3  . clobetasol cream (TEMOVATE) 0.05 % Apply  1 application topically 2 (two) times daily.    . diclofenac sodium (VOLTAREN) 1 % GEL Apply topically 4 (four) times daily.    . irbesartan (AVAPRO) 75 MG tablet Take 75 mg by mouth daily.    Marland Kitchen levothyroxine (SYNTHROID, LEVOTHROID) 50 MCG tablet Take 50 mcg by mouth every morning.    . meclizine (ANTIVERT) 25 MG tablet TAKE 1 TABLET BY MOUTH EVERY 6 HOURS FOR VERTIGO  2  . meloxicam (MOBIC) 15 MG tablet Take 15 mg by mouth daily.    . metoprolol succinate  (TOPROL-XL) 100 MG 24 hr tablet Take 100 mg by mouth daily. Take with or immediately following a meal.    . Multiple Vitamin (MULTIVITAMIN WITH MINERALS) TABS Take 1 tablet by mouth daily.    Marland Kitchen omeprazole (PRILOSEC) 20 MG capsule Take 20 mg by mouth daily.     No current facility-administered medications for this visit.    PHYSICAL EXAMINATION: ECOG PERFORMANCE STATUS: 1 - Symptomatic but completely ambulatory  There were no vitals filed for this visit. There were no vitals filed for this visit.  BREAST: No palpable masses or nodules.  Right mastectomy scar is palpated there is some extra tissue and she has difficulty with managing that and a breast prosthesis.  No palpable axillary supraclavicular or infraclavicular adenopathy no breast tenderness or nipple discharge. (exam performed in the presence of a chaperone)  LABORATORY DATA:  I have reviewed the data as listed CMP Latest Ref Rng & Units 07/31/2014 07/30/2014 07/30/2014  Glucose 65 - 99 mg/dL 144(H) 106(H) 110(H)  BUN 6 - 20 mg/dL _0 Creatinine 0.44 - 1.00 mg/dL 0.80 0.80 0.80  Sodium 135 - 145 mmol/L 138 139 139  Potassium 3.5 - 5.1 mmol/L 4.8 4.3 4.4  Chloride 101 - 111 mmol/L 105 104 106  CO2 22 - 32 mmol/L 23 - 26  Calcium 8.9 - 10.3 mg/dL 8.8(L) - 9.1  Total Protein 6.5 - 8.1 g/dL 6.5 - 6.4(L)  Total Bilirubin 0.3 - 1.2 mg/dL 0.4 - 0.4  Alkaline Phos 38 - 126 U/L 56 - 60  AST 15 - 41 U/L 20 - 24  ALT 14 - 54 U/L 18 - 20    Lab Results  Component Value Date   WBC 11.1 (H) 08/06/2014   HGB 11.7 (L) 08/06/2014   HCT 35.2 (L) 08/06/2014   MCV 91.4 08/06/2014   PLT 177 08/06/2014   NEUTROABS 5.2 07/30/2014    ASSESSMENT & PLAN:  Breast cancer of lower-outer quadrant of right female breast ([place]) Rt Mastectomy 08/05/14:  IDC Grade 2, 1.2 cm T1cN0 (stage 1A); ER 100%, PR 86%, Ki-67 15%, HER-2 negative ratio 1.13 ILC Grade 2, 2.4 cm T2N0 (stage 2A); 0/4 LN Neg; ER 99%, PR 30%, Ki-67 5%  Current treatment:  Antiestrogen therapy with anastrozole completed 06/26/2019 Rash: granuloma annulare    Breast cancer surveillance: 1. Left breast mammograms12/10/2019: Breast density category B, no findings suspicious for malignancy. 2. Breast exam5/10/2022does not reveal any palpable lumps or nodules.  Return to clinic in 1 yearfor follow-up     No orders of the defined types were placed in this encounter.  The patient has a good understanding of the overall plan. she agrees with it. she will call with any problems that may develop before the next visit here.  Total time spent: 20 mins including face to face time and time spent for planning, charting and coordination of care  Rulon Eisenmenger, MD, MPH 06/30/2020  I, Molly Dorshimer, am acting as scribe for Dr. Nicholas Lose.  I have reviewed the above documentation for accuracy and completeness, and I agree with the above.

## 2020-06-30 ENCOUNTER — Inpatient Hospital Stay: Payer: Medicare HMO | Attending: Hematology and Oncology | Admitting: Hematology and Oncology

## 2020-06-30 ENCOUNTER — Other Ambulatory Visit: Payer: Self-pay

## 2020-06-30 DIAGNOSIS — C50511 Malignant neoplasm of lower-outer quadrant of right female breast: Secondary | ICD-10-CM | POA: Diagnosis not present

## 2020-06-30 DIAGNOSIS — Z9011 Acquired absence of right breast and nipple: Secondary | ICD-10-CM | POA: Insufficient documentation

## 2020-06-30 DIAGNOSIS — Z79811 Long term (current) use of aromatase inhibitors: Secondary | ICD-10-CM | POA: Diagnosis not present

## 2020-06-30 DIAGNOSIS — Z170 Estrogen receptor positive status [ER+]: Secondary | ICD-10-CM | POA: Insufficient documentation

## 2020-06-30 DIAGNOSIS — L920 Granuloma annulare: Secondary | ICD-10-CM | POA: Diagnosis not present

## 2020-06-30 NOTE — Assessment & Plan Note (Signed)
Rt Mastectomy 08/05/14:  IDC Grade 2, 1.2 cm T1cN0 (stage 1A); ER 100%, PR 86%, Ki-67 15%, HER-2 negative ratio 1.13 ILC Grade 2, 2.4 cm T2N0 (stage 2A); 0/4 LN Neg; ER 99%, PR 30%, Ki-67 5%  Current treatment: Anti-estrogen therapy with anastrozole 1 mg daily. (Started approximately 08/18/2014), plan for 5 years. (I did not recommend Oncotype DX testing because of her elderly age and low Ki-67 score) completed 06/26/2019  Patient completed 5 years of antiestrogen therapy and can discontinue the tablets. However she thinks that the antiestrogen therapy is preventing her vertigo.  She is anxious about stopping it.   If her vertigo returns back then we can reinitiate antiestrogen therapy if she wishes to do so. Rash: granuloma annulare Her husband passed away last year and she is still very sad and grieving about it.  Breast cancer surveillance: 1. Left breast mammograms12/10/2019: Breast density category B, no findings suspicious for malignancy. 2. Breast exam5/10/2022does not reveal any palpable lumps or nodules.  Return to clinic in 1 yearfor follow-up

## 2020-08-13 DIAGNOSIS — H5201 Hypermetropia, right eye: Secondary | ICD-10-CM | POA: Diagnosis not present

## 2020-09-29 DIAGNOSIS — Z85828 Personal history of other malignant neoplasm of skin: Secondary | ICD-10-CM | POA: Diagnosis not present

## 2020-09-29 DIAGNOSIS — L570 Actinic keratosis: Secondary | ICD-10-CM | POA: Diagnosis not present

## 2020-09-29 DIAGNOSIS — L814 Other melanin hyperpigmentation: Secondary | ICD-10-CM | POA: Diagnosis not present

## 2020-09-29 DIAGNOSIS — L821 Other seborrheic keratosis: Secondary | ICD-10-CM | POA: Diagnosis not present

## 2020-10-14 DIAGNOSIS — N1831 Chronic kidney disease, stage 3a: Secondary | ICD-10-CM | POA: Diagnosis not present

## 2020-10-14 DIAGNOSIS — E039 Hypothyroidism, unspecified: Secondary | ICD-10-CM | POA: Diagnosis not present

## 2020-10-14 DIAGNOSIS — C50911 Malignant neoplasm of unspecified site of right female breast: Secondary | ICD-10-CM | POA: Diagnosis not present

## 2020-10-14 DIAGNOSIS — F3340 Major depressive disorder, recurrent, in remission, unspecified: Secondary | ICD-10-CM | POA: Diagnosis not present

## 2020-10-14 DIAGNOSIS — G4700 Insomnia, unspecified: Secondary | ICD-10-CM | POA: Diagnosis not present

## 2020-10-14 DIAGNOSIS — I2510 Atherosclerotic heart disease of native coronary artery without angina pectoris: Secondary | ICD-10-CM | POA: Diagnosis not present

## 2020-10-14 DIAGNOSIS — I129 Hypertensive chronic kidney disease with stage 1 through stage 4 chronic kidney disease, or unspecified chronic kidney disease: Secondary | ICD-10-CM | POA: Diagnosis not present

## 2020-10-14 DIAGNOSIS — R7301 Impaired fasting glucose: Secondary | ICD-10-CM | POA: Diagnosis not present

## 2020-10-14 DIAGNOSIS — E785 Hyperlipidemia, unspecified: Secondary | ICD-10-CM | POA: Diagnosis not present

## 2020-11-21 DIAGNOSIS — Z23 Encounter for immunization: Secondary | ICD-10-CM | POA: Diagnosis not present

## 2020-12-31 ENCOUNTER — Other Ambulatory Visit: Payer: Self-pay | Admitting: Internal Medicine

## 2020-12-31 DIAGNOSIS — Z1231 Encounter for screening mammogram for malignant neoplasm of breast: Secondary | ICD-10-CM

## 2021-02-03 ENCOUNTER — Ambulatory Visit: Payer: Medicare HMO

## 2021-03-04 ENCOUNTER — Ambulatory Visit
Admission: RE | Admit: 2021-03-04 | Discharge: 2021-03-04 | Disposition: A | Discharge status: Home / Self Care | Payer: Medicare HMO | Source: Ambulatory Visit | Attending: Internal Medicine | Admitting: Internal Medicine

## 2021-03-04 ENCOUNTER — Other Ambulatory Visit: Payer: Self-pay

## 2021-03-04 DIAGNOSIS — Z1231 Encounter for screening mammogram for malignant neoplasm of breast: Secondary | ICD-10-CM

## 2021-04-13 ENCOUNTER — Other Ambulatory Visit: Payer: Self-pay | Admitting: Hematology and Oncology

## 2021-05-05 DIAGNOSIS — E785 Hyperlipidemia, unspecified: Secondary | ICD-10-CM | POA: Diagnosis not present

## 2021-05-05 DIAGNOSIS — E559 Vitamin D deficiency, unspecified: Secondary | ICD-10-CM | POA: Diagnosis not present

## 2021-05-05 DIAGNOSIS — R7301 Impaired fasting glucose: Secondary | ICD-10-CM | POA: Diagnosis not present

## 2021-05-05 DIAGNOSIS — I10 Essential (primary) hypertension: Secondary | ICD-10-CM | POA: Diagnosis not present

## 2021-05-05 DIAGNOSIS — E039 Hypothyroidism, unspecified: Secondary | ICD-10-CM | POA: Diagnosis not present

## 2021-05-05 DIAGNOSIS — M859 Disorder of bone density and structure, unspecified: Secondary | ICD-10-CM | POA: Diagnosis not present

## 2021-05-12 DIAGNOSIS — R82998 Other abnormal findings in urine: Secondary | ICD-10-CM | POA: Diagnosis not present

## 2021-05-12 DIAGNOSIS — E039 Hypothyroidism, unspecified: Secondary | ICD-10-CM | POA: Diagnosis not present

## 2021-05-12 DIAGNOSIS — M8580 Other specified disorders of bone density and structure, unspecified site: Secondary | ICD-10-CM | POA: Diagnosis not present

## 2021-05-12 DIAGNOSIS — I10 Essential (primary) hypertension: Secondary | ICD-10-CM | POA: Diagnosis not present

## 2021-05-12 DIAGNOSIS — E785 Hyperlipidemia, unspecified: Secondary | ICD-10-CM | POA: Diagnosis not present

## 2021-05-12 DIAGNOSIS — C50911 Malignant neoplasm of unspecified site of right female breast: Secondary | ICD-10-CM | POA: Diagnosis not present

## 2021-05-12 DIAGNOSIS — N1831 Chronic kidney disease, stage 3a: Secondary | ICD-10-CM | POA: Diagnosis not present

## 2021-05-12 DIAGNOSIS — R7301 Impaired fasting glucose: Secondary | ICD-10-CM | POA: Diagnosis not present

## 2021-05-12 DIAGNOSIS — I2510 Atherosclerotic heart disease of native coronary artery without angina pectoris: Secondary | ICD-10-CM | POA: Diagnosis not present

## 2021-05-12 DIAGNOSIS — Z0000 Encounter for general adult medical examination without abnormal findings: Secondary | ICD-10-CM | POA: Diagnosis not present

## 2021-06-16 ENCOUNTER — Telehealth: Payer: Self-pay | Admitting: Hematology and Oncology

## 2021-06-16 NOTE — Telephone Encounter (Signed)
Rescheduled appointment per room resource. Patient was scheduled during [facility] Patient is aware of the changes made to her upcoming appointment. ?

## 2021-06-16 NOTE — Progress Notes (Incomplete)
? ?Patient Care Team: ?Prince Solian, MD as PCP - General (Internal Medicine) ?Excell Seltzer, MD (Inactive) as Consulting Physician (General Surgery) ?Nicholas Lose, MD as Consulting Physician (Hematology and Oncology) ?Sylvan Cheese, NP as Nurse Practitioner (Nurse Practitioner) ? ?DIAGNOSIS: No diagnosis found. ? ?SUMMARY OF ONCOLOGIC HISTORY: ?Oncology History  ?Breast cancer of lower-outer quadrant of right female breast ([place])  ?04/30/2014 Mammogram  ? Right breast: 4 x 3 x 22m oval nodule at 5:30-6:00, 5 cm from the nipple with adjacent calcifications ? ?  ?05/21/2014 Initial Biopsy  ? Right breast #1 at 7:00: Invasive lobular cancer ER+ (99%), PR+ (30%), Ki-67 5%, HER-2 negative (ratio 1.32), with LCIS. #2 at 6:00: Invasive ductal carcinoma ER+ (100%), PR+ (86%), Ki-67 15%, HER-2 negative (ratio 1.13) ? ?  ?05/26/2014 Breast MRI  ? Right breast: 1.4 x 1.5 x 1cm inferior mass correlating to 7:00; second clip artifact at 6:00; abnormal linear and focal enhancement extending from 7:00 mass toward nipple. 3.3 x 3.4 x 4.1cm area of enhancement asymmetric from left breast ? ?  ?05/26/2014 Clinical Stage  ? IDC: Stage IA (T1c N0 M0).  ILC: Stage 2A (T2 N0 M0) ? ?  ?06/02/2014 Procedure  ? Bx of right breast subaerolar region: LCIS ? ?  ?06/12/2014 Procedure  ? Genetic testing Ova Next gene panel (Ceph[facility]ing revealed no clinically significant variants for ATM, BARD1, BRCA1, BRCA2, BRIP1, CDH1, CHEK2, EPCAM, MLH1, MRE11A, MSH2, MSH6, MUTYH, NBN, NF1, PALB2, PMS2, PTEN, RAD50, RAD51C, RAD51D, SMARCA4, STK11, and TP53. ? ?  ?08/05/2014 Surgery  ? Right Mastectomy with SLNB ([facility]): IDC, grade 2, spanning 1.2 cm. ER+ (100%), PR+ (86%), HER2 neu negative (ratio 1.13), Ki-67 15%.  ILC, grade 2, spanning 2.4 cm, 0/4 LN Neg; ER+ (99%), PR+ (30%), HER2 negative (ratio 1.34) ? ?  ?08/05/2014 Pathologic Stage  ? IDC: Stage IA (T1c N0 M0).  ILC: Stage 2A (T2 N0 M0) ? ?  ?08/18/2014 -  Anti-estrogen oral therapy  ?  Anastrozole 1 mg daily ([place]).   ?  ?10/31/2014 Survivorship  ? A copy of the survivorship care plan was mailed to the patient in lieu of an in-person visit at her request. ? ?  ?Cancer of right breast ([place] (Res[place])  ?08/05/2014 Initial Diagnosis  ? Cancer of right breast ? ?  ? ? ?CHIEF COMPLIANT: Follow-up of right breast cancer on anastrozole. ? ?INTERVAL HISTORY: Jasmine ENTSMINGERis a ?87y.o. with above-mentioned history of right breast cancer treated with right mastectomy and is currently on antiestrogen therapy with anastrozole.  ? ?ALLERGIES:  is allergic to iodine and sulfa antibiotics. ? ?MEDICATIONS:  ?Current Outpatient Medications  ?Medication Sig Dispense Refill  ? acetaminophen (TYLENOL) 500 MG tablet Take 500 mg by mouth every 6 (six) hours as needed for mild pain or headache.    ? ALPRAZolam (XANAX) 0.5 MG tablet Take 0.5 mg by mouth 3 (three) times daily as needed for sleep or anxiety.    ? aspirin EC 81 MG tablet Take 81 mg by mouth daily.    ? Calcium Carb-Cholecalciferol (OYSTER SHELL CALCIUM W/D) 500-5 MG-MCG TABS TAKE 1 TABLET BY MOUTH EVERY DAY WITH BREAKFAST 90 tablet 3  ? clobetasol cream (TEMOVATE) 08.93% Apply 1 application topically 2 (two) times daily.    ? diclofenac sodium (VOLTAREN) 1 % GEL Apply topically 4 (four) times daily.    ? irbesartan (AVAPRO) 75 MG tablet Take 75 mg by mouth daily.    ? levothyroxine (SYNTHROID, LEVOTHROID) 50 MCG tablet  Take 50 mcg by mouth every morning.    ? meclizine (ANTIVERT) 25 MG tablet TAKE 1 TABLET BY MOUTH EVERY 6 HOURS FOR VERTIGO  2  ? meloxicam (MOBIC) 15 MG tablet Take 15 mg by mouth daily.    ? metoprolol succinate (TOPROL-XL) 100 MG 24 hr tablet Take 100 mg by mouth daily. Take with or immediately following a meal.    ? Multiple Vitamin (MULTIVITAMIN WITH MINERALS) TABS Take 1 tablet by mouth daily.    ? omeprazole (PRILOSEC) 20 MG capsule Take 20 mg by mouth daily.    ? ?No current facility-administered medications for this visit.   ? ? ?PHYSICAL EXAMINATION: ?ECOG PERFORMANCE STATUS: {CHL ONC ECOG [id number]} ? ?There were no vitals filed for this visit. ?There were no vitals filed for this visit. ? ?BREAST:*** No palpable masses or nodules in either right or left breasts. No palpable axillary supraclavicular or infraclavicular adenopathy no breast tenderness or nipple discharge. (exam performed in the presence of a chaperone) ? ?LABORATORY DATA:  ?I have reviewed the data as listed ? ?  Latest Ref Rng & Units 07/31/2014  ?  5:57 AM 07/30/2014  ?  2:59 PM 07/30/2014  ?  2:50 PM  ?CMP  ?Glucose 65 - 99 mg/dL 144   106   110    ?BUN 6 - 20 mg/dL _0 ?Creatinine 0.44 - 1.00 mg/dL 0.80   0.80   0.80    ?Sodium 135 - 145 mmol/L 138   139   139    ?Potassium 3.5 - 5.1 mmol/L 4.8   4.3   4.4    ?Chloride 101 - 111 mmol/L 105   104   106    ?CO2 22 - 32 mmol/L 23    26    ?Calcium 8.9 - 10.3 mg/dL 8.8    9.1    ?Total Protein 6.5 - 8.1 g/dL 6.5    6.4    ?Total Bilirubin 0.3 - 1.2 mg/dL 0.4    0.4    ?Alkaline Phos 38 - 126 U/L 56    60    ?AST 15 - 41 U/L 20    24    ?ALT 14 - 54 U/L 18    20    ? ? ?Lab Results  ?Component Value Date  ? WBC 11.1 (H) 08/06/2014  ? HGB 11.7 (L) 08/06/2014  ? HCT 35.2 (L) 08/06/2014  ? MCV 91.4 08/06/2014  ? PLT 177 08/06/2014  ? NEUTROABS 5.2 07/30/2014  ? ? ?ASSESSMENT & PLAN:  ?No problem-specific Assessment & Plan notes found for this encounter. ? ? ? ?No orders of the defined types were placed in this encounter. ? ?The patient has a good understanding of the overall plan. she agrees with it. she will call with any problems that may develop before the next visit here. ?Total time spent: 30 mins including face to face time and time spent for planning, charting and co-ordination of care ? ? Suzzette Righter, CMA ?06/16/21 ? ? ? I Gardiner Coins am scribing for Dr. Lindi Adie ? ?***  ?

## 2021-06-21 NOTE — Progress Notes (Signed)
? ?Patient Care Team: ?Prince Solian, MD as PCP - General (Internal Medicine) ?Excell Seltzer, MD (Inactive) as Consulting Physician (General Surgery) ?Nicholas Lose, MD as Consulting Physician (Hematology and Oncology) ?Sylvan Cheese, NP as Nurse Practitioner (Nurse Practitioner) ? ?DIAGNOSIS:  ?Encounter Diagnosis  ?Name Primary?  ? Malignant neoplasm of lower-outer quadrant of right breast of female, estrogen receptor positive ([place])   ? ? ?SUMMARY OF ONCOLOGIC HISTORY: ?Oncology History  ?Breast cancer of lower-outer quadrant of right female breast ([place])  ?04/30/2014 Mammogram  ? Right breast: 4 x 3 x 67m oval nodule at 5:30-6:00, 5 cm from the nipple with adjacent calcifications ? ?  ?05/21/2014 Initial Biopsy  ? Right breast #1 at 7:00: Invasive lobular cancer ER+ (99%), PR+ (30%), Ki-67 5%, HER-2 negative (ratio 1.32), with LCIS. #2 at 6:00: Invasive ductal carcinoma ER+ (100%), PR+ (86%), Ki-67 15%, HER-2 negative (ratio 1.13) ? ?  ?05/26/2014 Breast MRI  ? Right breast: 1.4 x 1.5 x 1cm inferior mass correlating to 7:00; second clip artifact at 6:00; abnormal linear and focal enhancement extending from 7:00 mass toward nipple. 3.3 x 3.4 x 4.1cm area of enhancement asymmetric from left breast ? ?  ?05/26/2014 Clinical Stage  ? IDC: Stage IA (T1c N0 M0).  ILC: Stage 2A (T2 N0 M0) ? ?  ?06/02/2014 Procedure  ? Bx of right breast subaerolar region: LCIS ? ?  ?06/12/2014 Procedure  ? Genetic testing [employer] [employer] gene panel ([facility] revealed no clinically significant variants for ATM, BARD1, BRCA1, BRCA2, BRIP1, CDH1, CHEK2, EPCAM, MLH1, MRE11A, MSH2, MSH6, MUTYH, NBN, NF1, PALB2, PMS2, PTEN, RAD50, RAD51C, RAD51D, SMARCA4, STK11, and TP53. ? ?  ?08/05/2014 Surgery  ? Right Mastectomy with SLNB ([facility]): IDC, grade 2, spanning 1.2 cm. ER+ (100%), PR+ (86%), HER2 neu negative (ratio 1.13), Ki-67 15%.  ILC, grade 2, spanning 2.4 cm, 0/4 LN Neg; ER+ (99%), PR+ (30%), HER2 negative (ratio 1.34) ? ?  ?08/05/2014  Pathologic Stage  ? IDC: Stage IA (T1c N0 M0).  ILC: Stage 2A (T2 N0 M0) ? ?  ?08/18/2014 -  Anti-estrogen oral therapy  ? Anastrozole 1 mg daily (Keithen Capo).   ?  ?10/31/2014 Survivorship  ? A copy of the survivorship care plan was mailed to the patient in lieu of an in-person visit at her request. ? ?  ?[place]  ?08/05/2014 Initial Diagnosis  ? Cancer of right breast ? ?  ? ? ?CHIEF COMPLIANT: Follow-up on anastrozole Therapy ? ?INTERVAL HISTORY: Jasmine LUCATEROis a 87y.o..o. with above-mentioned history of right breast cancer treated with right mastectomy and is currently on antiestrogen therapy with anastrozole. She presents to the clinic today for a follow-up. She state that she is sleeping better. She state she has a hard time wearing a bra because of the fatty tissues.  ? ?ALLERGIES:  is allergic to iodine and sulfa antibiotics. ? ?MEDICATIONS:  ?Current Outpatient Medications  ?Medication Sig Dispense Refill  ? acetaminophen (TYLENOL) 500 MG tablet Take 500 mg by mouth every 6 (six) hours as needed for mild pain or headache.    ? ALPRAZolam (XANAX) 0.5 MG tablet Take 0.5 mg by mouth 3 (three) times daily as needed for sleep or anxiety.    ? aspirin EC 81 MG tablet Take 81 mg by mouth daily.    ? Calcium Carb-Cholecalciferol (OYSTER SHELL CALCIUM W/D) 500-5 MG-MCG TABS TAKE 1 TABLET BY MOUTH EVERY DAY WITH BREAKFAST 90 tablet 3  ? clobetasol cream (TEMOVATE) 0.05 %  Apply 1 application topically 2 (two) times daily.    ? diclofenac sodium (VOLTAREN) 1 % GEL Apply topically 4 (four) times daily.    ? irbesartan (AVAPRO) 75 MG tablet Take 75 mg by mouth daily.    ? levothyroxine (SYNTHROID, LEVOTHROID) 50 MCG tablet Take 50 mcg by mouth every morning.    ? meclizine (ANTIVERT) 25 MG tablet TAKE 1 TABLET BY MOUTH EVERY 6 HOURS FOR VERTIGO  2  ? meloxicam (MOBIC) 15 MG tablet Take 15 mg by mouth daily.    ? metoprolol succinate (TOPROL-XL) 100 MG 24 hr tablet Take 100 mg by mouth daily.  Take with or immediately following a meal.    ? Multiple Vitamin (MULTIVITAMIN WITH MINERALS) TABS Take 1 tablet by mouth daily.    ? omeprazole (PRILOSEC) 20 MG capsule Take 20 mg by mouth daily.    ? ?No current facility-administered medications for this visit.  ? ? ?PHYSICAL EXAMINATION: ?ECOG PERFORMANCE STATUS: 1 - Symptomatic but completely ambulatory ? ?Vitals:  ? 07/05/21 1524  ?BP: 137/84  ?Pulse: 79  ?Resp: 18  ?Temp: 98.3 ?F (36.8 ?C)  ?SpO2: 98%  ? ?Filed Weights  ? 07/05/21 1524  ?Weight: 171 lb 14.4 oz (78 kg)  ? ? ?BREAST: No palpable masses or nodules in either right or left breasts. No palpable axillary supraclavicular or infraclavicular adenopathy no breast tenderness or nipple discharge. (exam performed in the presence of a chaperone) ? ?LABORATORY DATA:  ?I have reviewed the data as listed ? ?  Latest Ref Rng & Units 07/31/2014  ?  5:57 AM 07/30/2014  ?  2:59 PM 07/30/2014  ?  2:50 PM  ?CMP  ?Glucose 65 - 99 mg/dL 144   106   110    ?BUN 6 - 20 mg/dL _0 ?Creatinine 0.44 - 1.00 mg/dL 0.80   0.80   0.80    ?Sodium 135 - 145 mmol/L 138   139   139    ?Potassium 3.5 - 5.1 mmol/L 4.8   4.3   4.4    ?Chloride 101 - 111 mmol/L 105   104   106    ?CO2 22 - 32 mmol/L 23    26    ?Calcium 8.9 - 10.3 mg/dL 8.8    9.1    ?Total Protein 6.5 - 8.1 g/dL 6.5    6.4    ?Total Bilirubin 0.3 - 1.2 mg/dL 0.4    0.4    ?Alkaline Phos 38 - 126 U/L 56    60    ?AST 15 - 41 U/L 20    24    ?ALT 14 - 54 U/L 18    20    ? ? ?Lab Results  ?Component Value Date  ? WBC 11.1 (H) 08/06/2014  ? HGB 11.7 (L) 08/06/2014  ? HCT 35.2 (L) 08/06/2014  ? MCV 91.4 08/06/2014  ? PLT 177 08/06/2014  ? NEUTROABS 5.2 07/30/2014  ? ? ?ASSESSMENT & PLAN:  ?Breast cancer of lower-outer quadrant of right female breast ([place]) ?Rt Mastectomy 08/05/14:   ?IDC Grade 2, 1.2 cm T1cN0 (stage 1A); ER 100%, PR 86%, Ki-67 15%, HER-2 negative ratio 1.13 ?ILC Grade 2, 2.4 cm T2N0 (stage 2A); 0/4 LN Neg; ER 99%, PR 30%, Ki-67 5% ?  ?Current treatment:  Antiestrogen therapy with anastrozole completed 06/26/2019 ?Rash: granuloma annulare  ?  ?Breast cancer surveillance: ?1. Left breast mammograms 03/04/2021: Breast density category B, no  findings suspicious for malignancy. ?2. Breast exam 07/05/2021 does not reveal any palpable lumps or nodules. ?  ?Return to clinic on an as-needed basis ? ? ? ?No orders of the defined types were placed in this encounter. ? ?The patient has a good understanding of the overall plan. she agrees with it. she will call with any problems that may develop before the next visit here. ?Total time spent: 30 mins including face to face time and time spent for planning, charting and co-ordination of care ? ? Harriette Ohara, MD ?07/05/21 ? ? ? I Gardiner Coins am scribing for Dr. Lindi Adie ? ?I have reviewed the above documentation for accuracy and completeness, and I agree with the above. ?  ?

## 2021-06-30 ENCOUNTER — Inpatient Hospital Stay: Payer: Medicare HMO | Admitting: Hematology and Oncology

## 2021-07-05 ENCOUNTER — Other Ambulatory Visit: Payer: Self-pay

## 2021-07-05 ENCOUNTER — Inpatient Hospital Stay: Payer: Medicare HMO | Attending: Hematology and Oncology | Admitting: Hematology and Oncology

## 2021-07-05 DIAGNOSIS — L920 Granuloma annulare: Secondary | ICD-10-CM | POA: Insufficient documentation

## 2021-07-05 DIAGNOSIS — Z170 Estrogen receptor positive status [ER+]: Secondary | ICD-10-CM | POA: Diagnosis not present

## 2021-07-05 DIAGNOSIS — Z853 Personal history of malignant neoplasm of breast: Secondary | ICD-10-CM | POA: Insufficient documentation

## 2021-07-05 DIAGNOSIS — C50511 Malignant neoplasm of lower-outer quadrant of right female breast: Secondary | ICD-10-CM | POA: Diagnosis not present

## 2021-07-05 NOTE — Assessment & Plan Note (Signed)
Rt Mastectomy 08/05/14: ? ?IDC Grade 2, 1.2 cm T1cN0 (stage 1A); ER 100%, PR 86%, Ki-67 15%, HER-2 negative ratio 1.13 ?ILC Grade 2, 2.4 cm T2N0 (stage 2A); 0/4 LN Neg; ER 99%, PR 30%, Ki-67 5% ?? ?Current treatment: Antiestrogen therapy with anastrozole completed 06/26/2019 ?Rash: granuloma annulare  ?? ?Breast cancer surveillance: ?1. Left breast mammograms?03/04/2021: Breast density category B, no findings suspicious for malignancy. ?2. Breast exam?07/05/2021?does not reveal any palpable lumps or nodules. ?? ?Return to clinic in 1 year?for follow-up ?

## 2021-07-08 DIAGNOSIS — D2261 Melanocytic nevi of right upper limb, including shoulder: Secondary | ICD-10-CM | POA: Diagnosis not present

## 2021-07-08 DIAGNOSIS — D1801 Hemangioma of skin and subcutaneous tissue: Secondary | ICD-10-CM | POA: Diagnosis not present

## 2021-07-08 DIAGNOSIS — L821 Other seborrheic keratosis: Secondary | ICD-10-CM | POA: Diagnosis not present

## 2021-07-08 DIAGNOSIS — C44319 Basal cell carcinoma of skin of other parts of face: Secondary | ICD-10-CM | POA: Diagnosis not present

## 2021-07-08 DIAGNOSIS — L738 Other specified follicular disorders: Secondary | ICD-10-CM | POA: Diagnosis not present

## 2021-07-08 DIAGNOSIS — Z85828 Personal history of other malignant neoplasm of skin: Secondary | ICD-10-CM | POA: Diagnosis not present

## 2021-07-08 DIAGNOSIS — L918 Other hypertrophic disorders of the skin: Secondary | ICD-10-CM | POA: Diagnosis not present

## 2021-09-15 DIAGNOSIS — H31002 Unspecified chorioretinal scars, left eye: Secondary | ICD-10-CM | POA: Diagnosis not present

## 2021-10-11 DIAGNOSIS — L03011 Cellulitis of right finger: Secondary | ICD-10-CM | POA: Diagnosis not present

## 2021-10-11 DIAGNOSIS — Z85828 Personal history of other malignant neoplasm of skin: Secondary | ICD-10-CM | POA: Diagnosis not present

## 2021-10-11 DIAGNOSIS — L738 Other specified follicular disorders: Secondary | ICD-10-CM | POA: Diagnosis not present

## 2021-10-11 DIAGNOSIS — L821 Other seborrheic keratosis: Secondary | ICD-10-CM | POA: Diagnosis not present

## 2021-11-10 DIAGNOSIS — I10 Essential (primary) hypertension: Secondary | ICD-10-CM | POA: Diagnosis not present

## 2021-11-10 DIAGNOSIS — G4700 Insomnia, unspecified: Secondary | ICD-10-CM | POA: Diagnosis not present

## 2021-11-10 DIAGNOSIS — R7301 Impaired fasting glucose: Secondary | ICD-10-CM | POA: Diagnosis not present

## 2021-11-10 DIAGNOSIS — M8580 Other specified disorders of bone density and structure, unspecified site: Secondary | ICD-10-CM | POA: Diagnosis not present

## 2021-11-10 DIAGNOSIS — I2510 Atherosclerotic heart disease of native coronary artery without angina pectoris: Secondary | ICD-10-CM | POA: Diagnosis not present

## 2021-11-10 DIAGNOSIS — E785 Hyperlipidemia, unspecified: Secondary | ICD-10-CM | POA: Diagnosis not present

## 2021-11-10 DIAGNOSIS — N1831 Chronic kidney disease, stage 3a: Secondary | ICD-10-CM | POA: Diagnosis not present

## 2021-11-10 DIAGNOSIS — F3340 Major depressive disorder, recurrent, in remission, unspecified: Secondary | ICD-10-CM | POA: Diagnosis not present

## 2021-11-10 DIAGNOSIS — K219 Gastro-esophageal reflux disease without esophagitis: Secondary | ICD-10-CM | POA: Diagnosis not present

## 2021-12-11 DIAGNOSIS — Z23 Encounter for immunization: Secondary | ICD-10-CM | POA: Diagnosis not present

## 2021-12-24 DIAGNOSIS — J029 Acute pharyngitis, unspecified: Secondary | ICD-10-CM | POA: Diagnosis not present

## 2021-12-24 DIAGNOSIS — R051 Acute cough: Secondary | ICD-10-CM | POA: Diagnosis not present

## 2021-12-24 DIAGNOSIS — J209 Acute bronchitis, unspecified: Secondary | ICD-10-CM | POA: Diagnosis not present

## 2021-12-24 DIAGNOSIS — Z1152 Encounter for screening for COVID-19: Secondary | ICD-10-CM | POA: Diagnosis not present

## 2021-12-24 DIAGNOSIS — R0981 Nasal congestion: Secondary | ICD-10-CM | POA: Diagnosis not present

## 2021-12-24 DIAGNOSIS — R5383 Other fatigue: Secondary | ICD-10-CM | POA: Diagnosis not present

## 2022-03-17 ENCOUNTER — Other Ambulatory Visit: Payer: Self-pay | Admitting: Internal Medicine

## 2022-03-17 DIAGNOSIS — Z1231 Encounter for screening mammogram for malignant neoplasm of breast: Secondary | ICD-10-CM

## 2022-03-29 ENCOUNTER — Ambulatory Visit: Payer: Medicare HMO

## 2022-03-30 ENCOUNTER — Ambulatory Visit
Admission: RE | Admit: 2022-03-30 | Discharge: 2022-03-30 | Disposition: A | Discharge status: Home / Self Care | Payer: Medicare HMO | Source: Ambulatory Visit | Attending: Internal Medicine | Admitting: Internal Medicine

## 2022-03-30 ENCOUNTER — Other Ambulatory Visit: Payer: Self-pay | Admitting: Hematology and Oncology

## 2022-03-30 DIAGNOSIS — Z1231 Encounter for screening mammogram for malignant neoplasm of breast: Secondary | ICD-10-CM

## 2022-05-18 DIAGNOSIS — R7301 Impaired fasting glucose: Secondary | ICD-10-CM | POA: Diagnosis not present

## 2022-05-18 DIAGNOSIS — E785 Hyperlipidemia, unspecified: Secondary | ICD-10-CM | POA: Diagnosis not present

## 2022-05-18 DIAGNOSIS — E039 Hypothyroidism, unspecified: Secondary | ICD-10-CM | POA: Diagnosis not present

## 2022-05-18 DIAGNOSIS — E559 Vitamin D deficiency, unspecified: Secondary | ICD-10-CM | POA: Diagnosis not present

## 2022-05-18 DIAGNOSIS — R7989 Other specified abnormal findings of blood chemistry: Secondary | ICD-10-CM | POA: Diagnosis not present

## 2022-05-18 DIAGNOSIS — Z1212 Encounter for screening for malignant neoplasm of rectum: Secondary | ICD-10-CM | POA: Diagnosis not present

## 2022-05-18 DIAGNOSIS — K219 Gastro-esophageal reflux disease without esophagitis: Secondary | ICD-10-CM | POA: Diagnosis not present

## 2022-05-18 DIAGNOSIS — I10 Essential (primary) hypertension: Secondary | ICD-10-CM | POA: Diagnosis not present

## 2022-05-25 DIAGNOSIS — Z1339 Encounter for screening examination for other mental health and behavioral disorders: Secondary | ICD-10-CM | POA: Diagnosis not present

## 2022-05-25 DIAGNOSIS — I2510 Atherosclerotic heart disease of native coronary artery without angina pectoris: Secondary | ICD-10-CM | POA: Diagnosis not present

## 2022-05-25 DIAGNOSIS — Z1331 Encounter for screening for depression: Secondary | ICD-10-CM | POA: Diagnosis not present

## 2022-05-25 DIAGNOSIS — N1831 Chronic kidney disease, stage 3a: Secondary | ICD-10-CM | POA: Diagnosis not present

## 2022-05-25 DIAGNOSIS — Z0000 Encounter for general adult medical examination without abnormal findings: Secondary | ICD-10-CM | POA: Diagnosis not present

## 2022-05-25 DIAGNOSIS — R7301 Impaired fasting glucose: Secondary | ICD-10-CM | POA: Diagnosis not present

## 2022-05-25 DIAGNOSIS — I10 Essential (primary) hypertension: Secondary | ICD-10-CM | POA: Diagnosis not present

## 2022-05-25 DIAGNOSIS — F3340 Major depressive disorder, recurrent, in remission, unspecified: Secondary | ICD-10-CM | POA: Diagnosis not present

## 2022-05-25 DIAGNOSIS — E039 Hypothyroidism, unspecified: Secondary | ICD-10-CM | POA: Diagnosis not present

## 2022-05-25 DIAGNOSIS — I129 Hypertensive chronic kidney disease with stage 1 through stage 4 chronic kidney disease, or unspecified chronic kidney disease: Secondary | ICD-10-CM | POA: Diagnosis not present

## 2022-05-25 DIAGNOSIS — K219 Gastro-esophageal reflux disease without esophagitis: Secondary | ICD-10-CM | POA: Diagnosis not present

## 2022-05-25 DIAGNOSIS — Z23 Encounter for immunization: Secondary | ICD-10-CM | POA: Diagnosis not present

## 2022-05-25 DIAGNOSIS — E785 Hyperlipidemia, unspecified: Secondary | ICD-10-CM | POA: Diagnosis not present

## 2022-07-21 DIAGNOSIS — C50911 Malignant neoplasm of unspecified site of right female breast: Secondary | ICD-10-CM | POA: Diagnosis not present

## 2022-08-22 DIAGNOSIS — L700 Acne vulgaris: Secondary | ICD-10-CM | POA: Diagnosis not present

## 2022-08-22 DIAGNOSIS — Z85828 Personal history of other malignant neoplasm of skin: Secondary | ICD-10-CM | POA: Diagnosis not present

## 2022-08-22 DIAGNOSIS — L821 Other seborrheic keratosis: Secondary | ICD-10-CM | POA: Diagnosis not present

## 2022-08-22 DIAGNOSIS — L570 Actinic keratosis: Secondary | ICD-10-CM | POA: Diagnosis not present

## 2022-09-27 DIAGNOSIS — D225 Melanocytic nevi of trunk: Secondary | ICD-10-CM | POA: Diagnosis not present

## 2022-09-27 DIAGNOSIS — L821 Other seborrheic keratosis: Secondary | ICD-10-CM | POA: Diagnosis not present

## 2022-09-27 DIAGNOSIS — I8391 Asymptomatic varicose veins of right lower extremity: Secondary | ICD-10-CM | POA: Diagnosis not present

## 2022-09-27 DIAGNOSIS — L738 Other specified follicular disorders: Secondary | ICD-10-CM | POA: Diagnosis not present

## 2022-09-27 DIAGNOSIS — Z85828 Personal history of other malignant neoplasm of skin: Secondary | ICD-10-CM | POA: Diagnosis not present

## 2022-09-27 DIAGNOSIS — I8392 Asymptomatic varicose veins of left lower extremity: Secondary | ICD-10-CM | POA: Diagnosis not present

## 2022-10-13 DIAGNOSIS — H35372 Puckering of macula, left eye: Secondary | ICD-10-CM | POA: Diagnosis not present

## 2022-12-03 DIAGNOSIS — Z23 Encounter for immunization: Secondary | ICD-10-CM | POA: Diagnosis not present

## 2023-03-23 ENCOUNTER — Other Ambulatory Visit: Payer: Self-pay | Admitting: Hematology and Oncology

## 2023-03-28 ENCOUNTER — Other Ambulatory Visit: Payer: Self-pay | Admitting: Internal Medicine

## 2023-03-28 DIAGNOSIS — Z1231 Encounter for screening mammogram for malignant neoplasm of breast: Secondary | ICD-10-CM

## 2023-04-06 ENCOUNTER — Ambulatory Visit
Admission: RE | Admit: 2023-04-06 | Discharge: 2023-04-06 | Disposition: A | Discharge status: Home / Self Care | Payer: Medicare HMO | Source: Ambulatory Visit | Attending: Internal Medicine | Admitting: Internal Medicine

## 2023-04-06 DIAGNOSIS — Z1231 Encounter for screening mammogram for malignant neoplasm of breast: Secondary | ICD-10-CM

## 2023-05-24 DIAGNOSIS — I129 Hypertensive chronic kidney disease with stage 1 through stage 4 chronic kidney disease, or unspecified chronic kidney disease: Secondary | ICD-10-CM | POA: Diagnosis not present

## 2023-05-24 DIAGNOSIS — M8580 Other specified disorders of bone density and structure, unspecified site: Secondary | ICD-10-CM | POA: Diagnosis not present

## 2023-05-24 DIAGNOSIS — R7301 Impaired fasting glucose: Secondary | ICD-10-CM | POA: Diagnosis not present

## 2023-05-24 DIAGNOSIS — N1831 Chronic kidney disease, stage 3a: Secondary | ICD-10-CM | POA: Diagnosis not present

## 2023-05-24 DIAGNOSIS — E7849 Other hyperlipidemia: Secondary | ICD-10-CM | POA: Diagnosis not present

## 2023-05-24 DIAGNOSIS — E785 Hyperlipidemia, unspecified: Secondary | ICD-10-CM | POA: Diagnosis not present

## 2023-05-24 DIAGNOSIS — E039 Hypothyroidism, unspecified: Secondary | ICD-10-CM | POA: Diagnosis not present

## 2023-05-31 DIAGNOSIS — Z0000 Encounter for general adult medical examination without abnormal findings: Secondary | ICD-10-CM | POA: Diagnosis not present

## 2023-05-31 DIAGNOSIS — R7301 Impaired fasting glucose: Secondary | ICD-10-CM | POA: Diagnosis not present

## 2023-05-31 DIAGNOSIS — Z1331 Encounter for screening for depression: Secondary | ICD-10-CM | POA: Diagnosis not present

## 2023-05-31 DIAGNOSIS — F3340 Major depressive disorder, recurrent, in remission, unspecified: Secondary | ICD-10-CM | POA: Diagnosis not present

## 2023-05-31 DIAGNOSIS — E039 Hypothyroidism, unspecified: Secondary | ICD-10-CM | POA: Diagnosis not present

## 2023-05-31 DIAGNOSIS — I129 Hypertensive chronic kidney disease with stage 1 through stage 4 chronic kidney disease, or unspecified chronic kidney disease: Secondary | ICD-10-CM | POA: Diagnosis not present

## 2023-05-31 DIAGNOSIS — R82998 Other abnormal findings in urine: Secondary | ICD-10-CM | POA: Diagnosis not present

## 2023-05-31 DIAGNOSIS — C50911 Malignant neoplasm of unspecified site of right female breast: Secondary | ICD-10-CM | POA: Diagnosis not present

## 2023-05-31 DIAGNOSIS — I2510 Atherosclerotic heart disease of native coronary artery without angina pectoris: Secondary | ICD-10-CM | POA: Diagnosis not present

## 2023-05-31 DIAGNOSIS — Z1339 Encounter for screening examination for other mental health and behavioral disorders: Secondary | ICD-10-CM | POA: Diagnosis not present

## 2023-05-31 DIAGNOSIS — I10 Essential (primary) hypertension: Secondary | ICD-10-CM | POA: Diagnosis not present

## 2023-05-31 DIAGNOSIS — N1831 Chronic kidney disease, stage 3a: Secondary | ICD-10-CM | POA: Diagnosis not present

## 2023-05-31 DIAGNOSIS — E785 Hyperlipidemia, unspecified: Secondary | ICD-10-CM | POA: Diagnosis not present

## 2023-07-24 DIAGNOSIS — C50911 Malignant neoplasm of unspecified site of right female breast: Secondary | ICD-10-CM | POA: Diagnosis not present

## 2023-08-03 DIAGNOSIS — M76891 Other specified enthesopathies of right lower limb, excluding foot: Secondary | ICD-10-CM | POA: Diagnosis not present

## 2023-08-03 DIAGNOSIS — M1711 Unilateral primary osteoarthritis, right knee: Secondary | ICD-10-CM | POA: Diagnosis not present

## 2023-08-03 DIAGNOSIS — K219 Gastro-esophageal reflux disease without esophagitis: Secondary | ICD-10-CM | POA: Diagnosis not present

## 2023-08-03 DIAGNOSIS — I8390 Asymptomatic varicose veins of unspecified lower extremity: Secondary | ICD-10-CM | POA: Diagnosis not present

## 2023-08-03 DIAGNOSIS — I10 Essential (primary) hypertension: Secondary | ICD-10-CM | POA: Diagnosis not present

## 2023-08-03 DIAGNOSIS — M25551 Pain in right hip: Secondary | ICD-10-CM | POA: Diagnosis not present

## 2023-08-15 DIAGNOSIS — Z85828 Personal history of other malignant neoplasm of skin: Secondary | ICD-10-CM | POA: Diagnosis not present

## 2023-08-15 DIAGNOSIS — C44311 Basal cell carcinoma of skin of nose: Secondary | ICD-10-CM | POA: Diagnosis not present

## 2023-09-25 DIAGNOSIS — Z85828 Personal history of other malignant neoplasm of skin: Secondary | ICD-10-CM | POA: Diagnosis not present

## 2023-09-25 DIAGNOSIS — C44311 Basal cell carcinoma of skin of nose: Secondary | ICD-10-CM | POA: Diagnosis not present

## 2023-11-08 DIAGNOSIS — Z85828 Personal history of other malignant neoplasm of skin: Secondary | ICD-10-CM | POA: Diagnosis not present

## 2023-11-08 DIAGNOSIS — L821 Other seborrheic keratosis: Secondary | ICD-10-CM | POA: Diagnosis not present

## 2023-11-08 DIAGNOSIS — L650 Telogen effluvium: Secondary | ICD-10-CM | POA: Diagnosis not present

## 2023-11-08 DIAGNOSIS — C44311 Basal cell carcinoma of skin of nose: Secondary | ICD-10-CM | POA: Diagnosis not present

## 2023-11-08 DIAGNOSIS — C44319 Basal cell carcinoma of skin of other parts of face: Secondary | ICD-10-CM | POA: Diagnosis not present

## 2023-11-25 DIAGNOSIS — Z23 Encounter for immunization: Secondary | ICD-10-CM | POA: Diagnosis not present

## 2023-12-26 DIAGNOSIS — D2261 Melanocytic nevi of right upper limb, including shoulder: Secondary | ICD-10-CM | POA: Diagnosis not present

## 2023-12-26 DIAGNOSIS — L821 Other seborrheic keratosis: Secondary | ICD-10-CM | POA: Diagnosis not present

## 2023-12-26 DIAGNOSIS — Z85828 Personal history of other malignant neoplasm of skin: Secondary | ICD-10-CM | POA: Diagnosis not present

## 2023-12-26 DIAGNOSIS — L650 Telogen effluvium: Secondary | ICD-10-CM | POA: Diagnosis not present

## 2024-01-23 DIAGNOSIS — H43813 Vitreous degeneration, bilateral: Secondary | ICD-10-CM | POA: Diagnosis not present
